# Patient Record
Sex: Female | Born: 1960 | Race: White | Hispanic: No | Marital: Married | Smoking: Never smoker
Health system: Southern US, Community
[De-identification: ages and names within clinical notes are randomized; demographics above are authoritative.]

## PROBLEM LIST (undated history)

## (undated) DIAGNOSIS — K5790 Diverticulosis of intestine, part unspecified, without perforation or abscess without bleeding: Secondary | ICD-10-CM

## (undated) DIAGNOSIS — E785 Hyperlipidemia, unspecified: Secondary | ICD-10-CM

## (undated) DIAGNOSIS — R51 Headache: Secondary | ICD-10-CM

## (undated) DIAGNOSIS — T7840XA Allergy, unspecified, initial encounter: Secondary | ICD-10-CM

## (undated) DIAGNOSIS — T4145XA Adverse effect of unspecified anesthetic, initial encounter: Secondary | ICD-10-CM

## (undated) DIAGNOSIS — R7309 Other abnormal glucose: Secondary | ICD-10-CM

## (undated) DIAGNOSIS — F419 Anxiety disorder, unspecified: Secondary | ICD-10-CM

## (undated) DIAGNOSIS — K219 Gastro-esophageal reflux disease without esophagitis: Secondary | ICD-10-CM

## (undated) DIAGNOSIS — T8859XA Other complications of anesthesia, initial encounter: Secondary | ICD-10-CM

## (undated) DIAGNOSIS — I10 Essential (primary) hypertension: Secondary | ICD-10-CM

## (undated) DIAGNOSIS — F329 Major depressive disorder, single episode, unspecified: Secondary | ICD-10-CM

## (undated) DIAGNOSIS — R32 Unspecified urinary incontinence: Secondary | ICD-10-CM

## (undated) DIAGNOSIS — M1990 Unspecified osteoarthritis, unspecified site: Secondary | ICD-10-CM

## (undated) DIAGNOSIS — J45909 Unspecified asthma, uncomplicated: Secondary | ICD-10-CM

## (undated) DIAGNOSIS — G4700 Insomnia, unspecified: Secondary | ICD-10-CM

## (undated) DIAGNOSIS — Z460 Encounter for fitting and adjustment of spectacles and contact lenses: Secondary | ICD-10-CM

## (undated) HISTORY — DX: Essential (primary) hypertension: I10

## (undated) HISTORY — DX: Hyperlipidemia, unspecified: E78.5

## (undated) HISTORY — DX: Insomnia, unspecified: G47.00

## (undated) HISTORY — DX: Diverticulosis of intestine, part unspecified, without perforation or abscess without bleeding: K57.90

## (undated) HISTORY — DX: Headache: R51

## (undated) HISTORY — DX: Major depressive disorder, single episode, unspecified: F32.9

## (undated) HISTORY — DX: Other abnormal glucose: R73.09

## (undated) HISTORY — PX: COLONOSCOPY: SHX174

## (undated) HISTORY — DX: Unspecified urinary incontinence: R32

## (undated) HISTORY — DX: Anxiety disorder, unspecified: F41.9

## (undated) HISTORY — DX: Allergy, unspecified, initial encounter: T78.40XA

## (undated) HISTORY — DX: Gastro-esophageal reflux disease without esophagitis: K21.9

---

## 1998-03-25 HISTORY — PX: ABDOMINAL HYSTERECTOMY: SHX81

## 1998-03-25 HISTORY — PX: PARTIAL HYSTERECTOMY: SHX80

## 1998-06-29 ENCOUNTER — Inpatient Hospital Stay (HOSPITAL_COMMUNITY): Admission: RE | Admit: 1998-06-29 | Discharge: 1998-07-02 | Payer: Self-pay | Admitting: Obstetrics & Gynecology

## 1999-03-13 ENCOUNTER — Other Ambulatory Visit: Admission: RE | Admit: 1999-03-13 | Discharge: 1999-03-13 | Payer: Self-pay | Admitting: Obstetrics & Gynecology

## 2000-07-23 ENCOUNTER — Other Ambulatory Visit: Admission: RE | Admit: 2000-07-23 | Discharge: 2000-07-23 | Payer: Self-pay | Admitting: Obstetrics & Gynecology

## 2001-07-30 ENCOUNTER — Other Ambulatory Visit: Admission: RE | Admit: 2001-07-30 | Discharge: 2001-07-30 | Payer: Self-pay | Admitting: Obstetrics & Gynecology

## 2002-08-16 ENCOUNTER — Other Ambulatory Visit: Admission: RE | Admit: 2002-08-16 | Discharge: 2002-08-16 | Payer: Self-pay | Admitting: Obstetrics & Gynecology

## 2003-09-21 ENCOUNTER — Other Ambulatory Visit: Admission: RE | Admit: 2003-09-21 | Discharge: 2003-09-21 | Payer: Self-pay | Admitting: Obstetrics & Gynecology

## 2004-10-18 ENCOUNTER — Other Ambulatory Visit: Admission: RE | Admit: 2004-10-18 | Discharge: 2004-10-18 | Payer: Self-pay | Admitting: Obstetrics & Gynecology

## 2005-03-25 HISTORY — PX: MOHS SURGERY: SUR867

## 2006-06-05 ENCOUNTER — Emergency Department (HOSPITAL_COMMUNITY): Admission: EM | Admit: 2006-06-05 | Discharge: 2006-06-05 | Payer: Self-pay | Admitting: Emergency Medicine

## 2006-10-14 ENCOUNTER — Emergency Department (HOSPITAL_COMMUNITY): Admission: EM | Admit: 2006-10-14 | Discharge: 2006-10-14 | Payer: Self-pay | Admitting: Emergency Medicine

## 2008-01-12 ENCOUNTER — Emergency Department (HOSPITAL_COMMUNITY): Admission: EM | Admit: 2008-01-12 | Discharge: 2008-01-12 | Payer: Self-pay | Admitting: Family Medicine

## 2008-03-25 HISTORY — PX: SHOULDER ARTHROSCOPY: SHX128

## 2008-06-17 ENCOUNTER — Emergency Department (HOSPITAL_COMMUNITY): Admission: EM | Admit: 2008-06-17 | Discharge: 2008-06-17 | Payer: Self-pay | Admitting: Family Medicine

## 2008-08-09 ENCOUNTER — Encounter: Admission: RE | Admit: 2008-08-09 | Discharge: 2008-08-09 | Payer: Self-pay | Admitting: Family Medicine

## 2008-12-02 ENCOUNTER — Ambulatory Visit: Payer: Self-pay | Admitting: Family Medicine

## 2008-12-02 DIAGNOSIS — F329 Major depressive disorder, single episode, unspecified: Secondary | ICD-10-CM

## 2008-12-02 DIAGNOSIS — R51 Headache: Secondary | ICD-10-CM | POA: Insufficient documentation

## 2008-12-02 DIAGNOSIS — E785 Hyperlipidemia, unspecified: Secondary | ICD-10-CM

## 2008-12-02 DIAGNOSIS — R7309 Other abnormal glucose: Secondary | ICD-10-CM | POA: Insufficient documentation

## 2008-12-02 DIAGNOSIS — R32 Unspecified urinary incontinence: Secondary | ICD-10-CM

## 2008-12-02 DIAGNOSIS — K219 Gastro-esophageal reflux disease without esophagitis: Secondary | ICD-10-CM

## 2008-12-02 DIAGNOSIS — F339 Major depressive disorder, recurrent, unspecified: Secondary | ICD-10-CM | POA: Insufficient documentation

## 2008-12-02 DIAGNOSIS — G4700 Insomnia, unspecified: Secondary | ICD-10-CM | POA: Insufficient documentation

## 2008-12-02 DIAGNOSIS — F3289 Other specified depressive episodes: Secondary | ICD-10-CM

## 2008-12-02 DIAGNOSIS — R519 Headache, unspecified: Secondary | ICD-10-CM | POA: Insufficient documentation

## 2008-12-02 HISTORY — DX: Unspecified urinary incontinence: R32

## 2008-12-02 HISTORY — DX: Hyperlipidemia, unspecified: E78.5

## 2008-12-02 HISTORY — DX: Gastro-esophageal reflux disease without esophagitis: K21.9

## 2008-12-02 HISTORY — DX: Major depressive disorder, single episode, unspecified: F32.9

## 2008-12-02 HISTORY — DX: Other specified depressive episodes: F32.89

## 2008-12-02 HISTORY — DX: Insomnia, unspecified: G47.00

## 2008-12-02 HISTORY — DX: Headache: R51

## 2008-12-02 HISTORY — DX: Other abnormal glucose: R73.09

## 2008-12-05 LAB — CONVERTED CEMR LAB
BUN: 16 mg/dL (ref 6–23)
Bilirubin, Direct: 0.1 mg/dL (ref 0.0–0.3)
Chloride: 105 meq/L (ref 96–112)
Glucose, Bld: 83 mg/dL (ref 70–99)
Indirect Bilirubin: 0.4 mg/dL (ref 0.0–0.9)
LDL Cholesterol: 81 mg/dL (ref 0–99)
Potassium: 4.1 meq/L (ref 3.5–5.3)
VLDL: 41 mg/dL — ABNORMAL HIGH (ref 0–40)

## 2009-01-09 ENCOUNTER — Ambulatory Visit: Payer: Self-pay | Admitting: Family Medicine

## 2009-01-17 ENCOUNTER — Telehealth: Payer: Self-pay | Admitting: Family Medicine

## 2009-01-23 ENCOUNTER — Telehealth: Payer: Self-pay | Admitting: Family Medicine

## 2009-03-06 ENCOUNTER — Ambulatory Visit: Payer: Self-pay | Admitting: Family Medicine

## 2009-03-13 ENCOUNTER — Telehealth: Payer: Self-pay | Admitting: Family Medicine

## 2009-07-28 ENCOUNTER — Encounter: Payer: Self-pay | Admitting: Family Medicine

## 2009-08-07 ENCOUNTER — Ambulatory Visit: Payer: Self-pay | Admitting: Family Medicine

## 2009-08-15 ENCOUNTER — Ambulatory Visit: Payer: Self-pay | Admitting: Family Medicine

## 2009-08-15 DIAGNOSIS — J309 Allergic rhinitis, unspecified: Secondary | ICD-10-CM | POA: Insufficient documentation

## 2009-08-15 DIAGNOSIS — R040 Epistaxis: Secondary | ICD-10-CM | POA: Insufficient documentation

## 2009-08-25 ENCOUNTER — Encounter (INDEPENDENT_AMBULATORY_CARE_PROVIDER_SITE_OTHER): Payer: Self-pay | Admitting: *Deleted

## 2009-11-13 ENCOUNTER — Encounter (INDEPENDENT_AMBULATORY_CARE_PROVIDER_SITE_OTHER): Payer: Self-pay | Admitting: *Deleted

## 2009-11-16 ENCOUNTER — Ambulatory Visit: Payer: Self-pay | Admitting: Gastroenterology

## 2009-11-29 LAB — HM COLONOSCOPY

## 2009-12-15 ENCOUNTER — Ambulatory Visit: Payer: Self-pay | Admitting: Gastroenterology

## 2009-12-15 HISTORY — PX: COLONOSCOPY: SHX174

## 2010-02-01 ENCOUNTER — Ambulatory Visit: Payer: Self-pay | Admitting: Family Medicine

## 2010-02-28 ENCOUNTER — Ambulatory Visit: Payer: Self-pay | Admitting: Family Medicine

## 2010-02-28 DIAGNOSIS — M79609 Pain in unspecified limb: Secondary | ICD-10-CM | POA: Insufficient documentation

## 2010-04-26 NOTE — Procedures (Signed)
Summary: Colonoscopy  Patient: Tracy Strong Note: All result statuses are Final unless otherwise noted.  Tests: (1) Colonoscopy (COL)   COL Colonoscopy           DONE     [facility]     [address]     [place], [place]  [address]           COLONOSCOPY PROCEDURE REPORT     PATIENT:  [name], [name]  MR#:  [id number]     BIRTHDATE:  Mar 06, 1961, 49 yrs. old  GENDER:  female     ENDOSCOPIST:  Judie Petit T. Russella Dar, MD, [facility]     Referred by:  Evelena Peat, M.D.     PROCEDURE DATE:  12/15/2009     PROCEDURE:  Colonoscopy [address]     ASA CLASS:  Class II     INDICATIONS:  1) Routine Risk Screening     MEDICATIONS:   Fentanyl 100 mcg IV, Versed 10 mg IV     DESCRIPTION OF PROCEDURE:   After the risks benefits and     alternatives of the procedure were thoroughly explained, informed     consent was obtained.  Digital rectal exam was performed and     revealed no abnormalities.   The LB [id number] endoscope     was introduced through the anus and advanced to the cecum, which     was identified by both the appendix and ileocecal valve, without     limitations.  The quality of the prep was excellent, using     MoviPrep.  The instrument was then slowly withdrawn as the colon     was fully examined.     <<PROCEDUREIMAGES>>     FINDINGS:  Mild diverticulosis was found in the sigmoid colon.  A     normal appearing cecum, ileocecal valve, and appendiceal orifice     were identified. The ascending, hepatic flexure, transverse,     splenic flexure, descending colon, and rectum appeared     unremarkable. Retroflexed views in the rectum revealed no     abnormalities. The time to cecum =  2  minutes. The scope was then     withdrawn (time =  8.33  min) from the patient and the procedure     completed.           COMPLICATIONS:  None           ENDOSCOPIC IMPRESSION:     1) Mild diverticulosis in the sigmoid colon           RECOMMENDATIONS:     1) High fiber diet with liberal fluid  intake.     2) Continue current colorectal screening for "routine risk"     patients with a repeat colonoscopy in 10 years.           Venita Lick. Russella Dar, MD, [facility]           n.     eSIGNED:   Venita Lick. Stark at 12/15/2009 04:08 PM           Willette Alma, [phone]  Note: An exclamation mark (!) indicates a result that was not dispersed into the flowsheet. Document Creation Date: 12/15/2009 4:08 PM _______________________________________________________________________  (1) Order result status: Final Collection or observation date-time: 12/15/2009 16:05 Requested date-time:  Receipt date-time:  Reported date-time:  Referring Physician:   Ordering Physician: Claudette Head [phone]) Specimen Source:  Source: Launa Grill Order Number: [id number] Lab site:   Appended Document: Colonoscopy  Clinical Lists Changes  Observations: Added new observation of COLONNXTDUE: 11/2019 (12/15/2009 16:15)

## 2010-04-26 NOTE — Assessment & Plan Note (Signed)
Summary: PT WILL COME IN AT 1:00 FOR FLU SHOT//SLM  Nurse Visit   Allergies: 1)  Avelox (Moxifloxacin Hcl)  Orders Added: 1)  Admin 1st Vaccine [[address]71] 2)  Flu Vaccine 26yrs + [[address]]        Flu Vaccine Consent Questions     Do you have a history of severe allergic reactions to this vaccine? no    Any prior history of allergic reactions to egg and/or gelatin? no    Do you have a sensitivity to the preservative Thimersol? no    Do you have a past history of Guillan-Barre Syndrome? no    Do you currently have an acute febrile illness? no    Have you ever had a severe reaction to latex? no    Vaccine information given and explained to patient? yes    Are you currently pregnant? no    Lot Number:[id number]   Exp Date:09/22/2010   Site Given  Left Deltoid IM

## 2010-04-26 NOTE — Letter (Signed)
Summary: [facility] Instructions  [facility]  [address] [place], [place] [address]   Phone: [phone]  Fax: [phone]       Tracy Strong    1960/08/27    MRN: [id number]        Procedure Day /Date:  Tuesday 11/28/2009      Arrival Time: 10:00 am      Procedure Time: 11:00 am     Location of Procedure:                    _x _  [facility] (4th Floor)                        PREPARATION FOR COLONOSCOPY WITH MOVIPREP   Starting 5 days prior to your procedure Thursday 9/1 do not eat nuts, seeds, popcorn, corn, beans, peas,  salads, or any raw vegetables.  Do not take any fiber supplements (e.g. Metamucil, Citrucel, and Benefiber).  THE DAY BEFORE YOUR PROCEDURE         DATE: Monday 9/5  1.  Drink clear liquids the entire day-NO SOLID FOOD  2.  Do not drink anything colored red or purple.  Avoid juices with pulp.  No orange juice.  3.  Drink at least 64 oz. (8 glasses) of fluid/clear liquids during the day to prevent dehydration and help the prep work efficiently.  CLEAR LIQUIDS INCLUDE: Water Jello Ice Popsicles Tea (sugar ok, no milk/cream) Powdered fruit flavored drinks Coffee (sugar ok, no milk/cream) Gatorade Juice: apple, white grape, white cranberry  Lemonade Clear bullion, consomm, broth Carbonated beverages (any kind) Strained chicken noodle soup Hard Candy                             4.  In the morning, mix first dose of MoviPrep solution:    Empty 1 Pouch A and 1 Pouch B into the disposable container    Add lukewarm drinking water to the top line of the container. Mix to dissolve    Refrigerate (mixed solution should be used within 24 hrs)  5.  Begin drinking the prep at 5:00 p.m. The MoviPrep container is divided by 4 marks.   Every 15 minutes drink the solution down to the next mark (approximately 8 oz) until the full liter is complete.   6.  Follow completed prep with 16 oz of clear liquid of your choice (Nothing  red or purple).  Continue to drink clear liquids until bedtime.  7.  Before going to bed, mix second dose of MoviPrep solution:    Empty 1 Pouch A and 1 Pouch B into the disposable container    Add lukewarm drinking water to the top line of the container. Mix to dissolve    Refrigerate  THE DAY OF YOUR PROCEDURE      DATE: Tuesday 9/6  Beginning at 6:00 a.m. (5 hours before procedure):         1. Every 15 minutes, drink the solution down to the next mark (approx 8 oz) until the full liter is complete.  2. Follow completed prep with 16 oz. of clear liquid of your choice.    3. You may drink clear liquids until 9:00 am (2 HOURS BEFORE PROCEDURE).   MEDICATION INSTRUCTIONS  Unless otherwise instructed, you should take regular prescription medications with a small sip of water   as early as possible the morning  of your procedure.           OTHER INSTRUCTIONS  You will need a responsible adult at least 51 years of age to accompany you and drive you home.   This person must remain in the waiting room during your procedure.  Wear loose fitting clothing that is easily removed.  Leave jewelry and other valuables at home.  However, you may wish to bring a book to read or  an iPod/MP3 player to listen to music as you wait for your procedure to start.  Remove all body piercing jewelry and leave at home.  Total time from sign-in until discharge is approximately 2-3 hours.  You should go home directly after your procedure and rest.  You can resume normal activities the  day after your procedure.  The day of your procedure you should not:   Drive   Make legal decisions   Operate machinery   Drink alcohol   Return to work  You will receive specific instructions about eating, activities and medications before you leave.    The above instructions have been reviewed and explained to me by   Ezra Sites RN  November 16, 2009 1:33 PM    I fully understand and can  verbalize these instructions _____________________________ Date _________

## 2010-04-26 NOTE — Assessment & Plan Note (Signed)
Summary: frequent nose bleeds/dm   Vital Signs:  Patient profile:   50 year old female Menstrual status:  hysterectomy Weight:      189 pounds Temp:     97.8 degrees F oral BP sitting:   120 / 82  (left arm) Cuff size:   regular  Vitals Entered By: Sid Falcon LPN (Aug 15, 2009 9:36 AM) CC: frequent nose bleeds   History of Present Illness: Recent Epistaxis R nose with 2 episodes yesterday lasting about 2 minutes each. No ASA use.  used saline nasal spray. Improved with pressure.  Using Claritan which she thinks is excessively drying. No other bleeding problems.  Allergies: 1)  Avelox (Moxifloxacin Hcl)  Past History:  Past Medical History: Last updated: 12/02/2008 Chicken pox Depression GERD Headache/Migraines Hyperlipidemia Urinary incontinence Hay fever/allergies Hx basal cell ca face Cronic insomnia  Review of Systems  The patient denies anorexia, fever, weight loss, and headaches.    Physical Exam  General:  Well-developed,well-nourished,in no acute distress; alert,appropriate and cooperative throughout examination Ears:  External ear exam shows no significant lesions or deformities.  Otoscopic examination reveals clear canals, tympanic membranes are intact bilaterally without bulging, retraction, inflammation or discharge. Hearing is grossly normal bilaterally. Nose:  R naris dry mucosa with scant dried blood ant nasal septum.  L naris clear. Mouth:  Oral mucosa and oropharynx without lesions or exudates.  Teeth in good repair. Lungs:  Normal respiratory effort, chest expands symmetrically. Lungs are clear to auscultation, no crackles or wheezes. Heart:  Normal rate and regular rhythm. S1 and S2 normal without gallop, murmur, click, rub or other extra sounds.   Impression & Recommendations:  Problem # 1:  NOSEBLEED (ICD-784.7) Assessment New suspect sec to drying.  Hold claritan.  Nasal vaseline and cont saline nose spray.  Problem # 2:  ALLERGIC  RHINITIS (ICD-477.9) trial of singulair 10 mg by mouth once daily.  Complete Medication List: 1)  Nexium 40 Mg Cpdr (Esomeprazole magnesium) .... Once daily 2)  Lexapro 10 Mg Tabs (Escitalopram oxalate) .... 1/2 tab daily 3)  Crestor 10 Mg Tabs (Rosuvastatin calcium) .... Once daily 4)  Vivelle-dot 0.1 Mg/24hr Pttw (Estradiol) .... Twice weekly 5)  Calcium 600 1500 Mg Tabs (Calcium carbonate) .... Once daily 6)  Daily Multiple Vitamins Tabs (Multiple vitamin) .... Once daily 7)  Zolpidem Tartrate 10 Mg Tabs (Zolpidem tartrate) .... One by mouth at bedtime 8)  Alprazolam 0.25 Mg Tabs (Alprazolam) .... One by mouth every 8 hours as needed anxiety. 9)  Vit D-3 200mg   .... Once daily 10)  Singulair 10 Mg Tabs (Montelukast sodium) .... One by mouth once daily as needed allergies.  Patient Instructions: 1)  continue use of saline nasal spray 2)  Consider use of Vaseline to right naris at night  Prescriptions: SINGULAIR 10 MG TABS (MONTELUKAST SODIUM) one by mouth once daily as needed allergies.  #30 x 6   Entered and Authorized by:   Evelena Peat MD   Signed by:   Evelena Peat MD on 08/15/2009   Method used:   Print then Give to Patient   RxID:   [id number]

## 2010-04-26 NOTE — Letter (Signed)
Summary: Previsit letter  [facility]  [address] [place], [place] [address]   Phone: [phone]  Fax: [phone]       08/25/2009 MRN: [id number]  [facility] [address] [place], [place]  [address]  Dear Tracy Strong,  Welcome to the Gastroenterology Division at [facility].    You are scheduled to see a nurse for your pre-procedure visit on 09-21-09 at 11:00a.m. on the 3rd floor at [facility], [address]  We ask that you try to arrive at our office 15 minutes prior to your appointment time to allow for check-in.  Your nurse visit will consist of discussing your medical and surgical history, your immediate family medical history, and your medications.    Please bring a complete list of all your medications or, if you prefer, bring the medication bottles and we will list them.  We will need to be aware of both prescribed and over the counter drugs.  We will need to know exact dosage information as well.  If you are on blood thinners (Coumadin, Plavix, Aggrenox, Ticlid, etc.) please call our office today/prior to your appointment, as we need to consult with your physician about holding your medication.   Please be prepared to read and sign documents such as consent forms, a financial agreement, and acknowledgement forms.  If necessary, and with your consent, a friend or relative is welcome to sit-in on the nurse visit with you.  Please bring your insurance card so that we may make a copy of it.  If your insurance requires a referral to see a specialist, please bring your referral form from your primary care physician.  No co-pay is required for this nurse visit.     If you cannot keep your appointment, please call [phone] to cancel or reschedule prior to your appointment date.  This allows [place] the opportunity to schedule an appointment for another patient in need of care.    Thank you for choosing [facility] for your medical  needs.  We appreciate the opportunity to care for you.  Please visit [place] at our website  to learn more about our practice.                     Sincerely.                                                                                                                   The Gastroenterology Division

## 2010-04-26 NOTE — Assessment & Plan Note (Signed)
Summary: 6 MTH ROV // RS   Vital Signs:  Patient profile:   50 year old female Menstrual status:  hysterectomy Weight:      189 pounds Temp:     98.2 degrees F oral BP sitting:   110 / 80  (left arm) Cuff size:   regular  Vitals Entered By: Sid Falcon LPN (Aug 07, 2009 1:27 PM) CC: 6 month follow-up   History of Present Illness: Patient for followup multiple medical items as below.  History of GERD controlled with Nexium. She has breakthrough symptoms when trying to discontinue. Symptoms not controlled with over-the-counter antacids.  Hyperlipidemia treated Crestor. Tolerating well. Lipids at goal last September.  Chronic insomnia. Tried reducing Ambien to 5 mg but had difficulty sleeping. She has titrated back to 10 mg.  No specific stressors.  Denies depressive symptoms.  Patient turned 50 earlier this year. No history of screening colonoscopy. She would like to schedule. No FH of colon cancer or polyps.  No recent change in bowel habits.  Allergies: 1)  Avelox (Moxifloxacin Hcl)  Past History:  Past Medical History: Last updated: 12/02/2008 Chicken pox Depression GERD Headache/Migraines Hyperlipidemia Urinary incontinence Hay fever/allergies Hx basal cell ca face Cronic insomnia  Past Surgical History: Last updated: 12/02/2008 Partial hyst 2000 moh's surgery nose 2007  Family History: Last updated: 12/02/2008 Family History Hypertension Family History Lung cancer Family History of Prostate CA Family history heart disease  Social History: Last updated: 12/02/2008 Widow/Widower Never Smoked Alcohol use-yes two children  Risk Factors: Smoking Status: never (12/02/2008)  Review of Systems       The patient complains of weight gain.  The patient denies anorexia, fever, weight loss, chest pain, syncope, dyspnea on exertion, peripheral edema, prolonged cough, headaches, hemoptysis, abdominal pain, melena, hematochezia, and severe  indigestion/heartburn.    Physical Exam  General:  Well-developed,well-nourished,in no acute distress; alert,appropriate and cooperative throughout examination Head:  Normocephalic and atraumatic without obvious abnormalities. No apparent alopecia or balding. Mouth:  Oral mucosa and oropharynx without lesions or exudates.  Teeth in good repair. Neck:  No deformities, masses, or tenderness noted. Lungs:  Normal respiratory effort, chest expands symmetrically. Lungs are clear to auscultation, no crackles or wheezes. Heart:  Normal rate and regular rhythm. S1 and S2 normal without gallop, murmur, click, rub or other extra sounds.   Impression & Recommendations:  Problem # 1:  HYPERLIPIDEMIA (ICD-272.4) repeat lipids in 6 months. Her updated medication list for this problem includes:    Crestor 10 Mg Tabs (Rosuvastatin calcium) ..... Once daily  Problem # 2:  GERD (ICD-530.81)  Her updated medication list for this problem includes:    Nexium 40 Mg Cpdr (Esomeprazole magnesium) ..... Once daily  Problem # 3:  INSOMNIA (ICD-780.52) sleep hygiene discussed. Her updated medication list for this problem includes:    Zolpidem Tartrate 10 Mg Tabs (Zolpidem tartrate) ..... One by mouth at bedtime  Problem # 4:  Preventive Health Care (ICD-V70.0) needs screening colonoscopy.  Pt sees gynecologist for mammograms and pap smears.  Complete Medication List: 1)  Nexium 40 Mg Cpdr (Esomeprazole magnesium) .... Once daily 2)  Lexapro 10 Mg Tabs (Escitalopram oxalate) .... 1/2 tab daily 3)  Crestor 10 Mg Tabs (Rosuvastatin calcium) .... Once daily 4)  Vivelle-dot 0.1 Mg/24hr Pttw (Estradiol) .... Twice weekly 5)  Calcium 600 1500 Mg Tabs (Calcium carbonate) .... Once daily 6)  Daily Multiple Vitamins Tabs (Multiple vitamin) .... Once daily 7)  Zolpidem Tartrate 10 Mg Tabs (Zolpidem tartrate) .Marland KitchenMarland KitchenMarland Kitchen  One by mouth at bedtime 8)  Alprazolam 0.25 Mg Tabs (Alprazolam) .... One by mouth every 8 hours as  needed anxiety. 9)  Vit D-3 200mg   .... Once daily  Other Orders: Gastroenterology Referral (GI)  Patient Instructions: 1)  Please schedule a follow-up appointment in 6 months .  2)  It is important that you exercise reguarly at least 20 minutes 5 times a week. If you develop chest pain, have severe difficulty breathing, or feel very tired, stop exercising immediately and seek medical attention.  3)  You need to lose weight. Consider a lower calorie diet and regular exercise.  Prescriptions: ZOLPIDEM TARTRATE 10 MG TABS (ZOLPIDEM TARTRATE) one by mouth at bedtime  #90 x 1   Entered and Authorized by:   Evelena Peat MD   Signed by:   Evelena Peat MD on 08/07/2009   Method used:   Print then Give to Patient   RxID:   [id number] CRESTOR 10 MG TABS (ROSUVASTATIN CALCIUM) once daily  #90 x 3   Entered and Authorized by:   Evelena Peat MD   Signed by:   Evelena Peat MD on 08/07/2009   Method used:   Print then Give to Patient   RxID:   [id number] NEXIUM 40 MG CPDR (ESOMEPRAZOLE MAGNESIUM) once daily  #90 x 3   Entered and Authorized by:   Evelena Peat MD   Signed by:   Evelena Peat MD on 08/07/2009   Method used:   Print then Give to Patient   RxID:   [id number]   Preventive Care Screening  Mammogram:    Date:  05/23/2009    Results:  normal   Pap Smear:    Date:  05/23/2009    Results:  normal

## 2010-04-26 NOTE — Assessment & Plan Note (Signed)
Summary: ?blood clot in leg/pain in leg/cjr   Vital Signs:  Patient profile:   50 year old female Menstrual status:  hysterectomy Weight:      183 pounds Temp:     98.0 degrees F oral BP sitting:   120 / 80  (left arm) Cuff size:   regular  Vitals Entered By: Sid Falcon LPN (February 28, 2010 1:57 PM)  History of Present Illness: patient seen with right calf pain. Occurred while bowling last night around 7:30 PM. First noted pain when she was going down to release the ball. She continued to bowl. Soreness is increased today. Pain is worse with going up and down stairs. No ecchymosis. No Achilles pain.  Patient was concerned about possible blood clot though she's not noticed any edema or color changes to lower extremity. No personal history of DVT and no family history in first degree relatives other than her daughter who had thoracic outlet syndrome.  Allergies: 1)  Avelox (Moxifloxacin Hcl)  Past History:  Past Medical History: Last updated: 12/02/2008 Chicken pox Depression GERD Headache/Migraines Hyperlipidemia Urinary incontinence Hay fever/allergies Hx basal cell ca face Cronic insomnia  Past Surgical History: Last updated: 12/02/2008 Partial hyst 2000 moh's surgery nose 2007  Family History: Last updated: 12/02/2008 Family History Hypertension Family History Lung cancer Family History of Prostate CA Family history heart disease  Social History: Last updated: 12/02/2008 Widow/Widower Never Smoked Alcohol use-yes two children  Risk Factors: Smoking Status: never (12/02/2008)  Physical Exam  General:  Well-developed,well-nourished,in no acute distress; alert,appropriate and cooperative throughout examination Lungs:  Normal respiratory effort, chest expands symmetrically. Lungs are clear to auscultation, no crackles or wheezes. Heart:  Normal rate and regular rhythm. S1 and S2 normal without gallop, murmur, click, rub or other extra  sounds. Extremities:  right calf reveals no ecchymosis or visible swelling. She has some minimal tenderness along the medial aspect of gastrocnemius muscle. Minimal pain with plantar flexion but none with dorsiflexion   Impression & Recommendations:  Problem # 1:  CALF PAIN (ICD-729.5) Assessment New Clinically, no evidence for DVT. Suspected gastrocnemius muscle strain. Recommend ice and gentle stretches an over-the-counter anti-inflammatory as needed  Complete Medication List: 1)  Nexium 40 Mg Cpdr (Esomeprazole magnesium) .... Once daily 2)  Lexapro 10 Mg Tabs (Escitalopram oxalate) .... 1/2 tab daily 3)  Crestor 10 Mg Tabs (Rosuvastatin calcium) .... Once daily 4)  Vivelle-dot 0.1 Mg/24hr Pttw (Estradiol) .... Twice weekly 5)  Calcium 600 1500 Mg Tabs (Calcium carbonate) .... Once daily 6)  Daily Multiple Vitamins Tabs (Multiple vitamin) .... Once daily 7)  Zolpidem Tartrate 10 Mg Tabs (Zolpidem tartrate) .... One by mouth at bedtime 8)  Alprazolam 0.25 Mg Tabs (Alprazolam) .... One by mouth every 8 hours as needed anxiety. 9)  Vit D-3 200mg   .... Once daily 10)  Singulair 10 Mg Tabs (Montelukast sodium) .... One by mouth once daily as needed allergies.  Patient Instructions: 1)  Ice calf muscle for the next one to 2 days 2)  General stretches to calf muscle after acute pain subsides 3)  Try over-the-counter Aleve or Advil for the next few days as tolerated 4)  Please schedule a follow-up appointment in 3 months .  5)  Hepatic Panel prior to visit ICD-9: 272.4 6)  Lipid panel prior to visit ICD-9 : 272.4 Prescriptions: ALPRAZOLAM 0.25 MG TABS (ALPRAZOLAM) One by mouth every 8 hours as needed anxiety.  #30 x 0   Entered and Authorized by:   Smitty Cords  Burchette MD   Signed by:   Evelena Peat MD on 02/28/2010   Method used:   Print then Give to Patient   RxID:   [id number]    Orders Added: 1)  Est. Patient Level III [[address]]

## 2010-04-26 NOTE — Miscellaneous (Signed)
Summary: [facility]  Clinical Lists Changes  Medications: Added new medication of MOVIPREP 100 GM  SOLR (PEG-KCL-NACL-NASULF-NA ASC-C) As per prep instructions. - Signed Rx of MOVIPREP 100 GM  SOLR (PEG-KCL-NACL-NASULF-NA ASC-C) As per prep instructions.;  #1 x 0;  Signed;  Entered by: Ezra Sites RN;  Authorized by: Meryl Dare MD [facility];  Method used: Electron[facility] to [id number] [address] [place], [place]  [address], Ph: [phone] or [phone], Fax: [phone] Allergies: Changed allergy or adverse reaction from AVELOX (MOXIFLOXACIN HCL) to AVELOX (MOXIFLOXACIN HCL)    Prescriptions: MOVIPREP 100 GM  SOLR (PEG-KCL-NACL-NASULF-NA ASC-C) As per prep instructions.  #1 x 0   Entered by:   Ezra Sites RN   Authorized by:   Meryl Dare MD [facility]   Signed by:   Ezra Sites RN on 11/16/2009   Method used:   [facility] to        [phone][facility] (retail)       [address]       [place], [place]  [address]       Ph: [phone] or [phone]       Fax: [phone]   RxID:   [id number]

## 2010-04-26 NOTE — Letter (Signed)
Summary: [facility]  [facility]   Imported By: Maryln Gottron 08/17/2009 15:32:45  _____________________________________________________________________  External Attachment:    Type:   Image     Comment:   External Document

## 2010-05-02 LAB — HM MAMMOGRAPHY: HM Mammogram: NORMAL

## 2010-05-25 ENCOUNTER — Other Ambulatory Visit (INDEPENDENT_AMBULATORY_CARE_PROVIDER_SITE_OTHER): Admitting: Family Medicine

## 2010-05-25 DIAGNOSIS — E785 Hyperlipidemia, unspecified: Secondary | ICD-10-CM

## 2010-05-25 DIAGNOSIS — T887XXA Unspecified adverse effect of drug or medicament, initial encounter: Secondary | ICD-10-CM

## 2010-05-25 LAB — HEPATIC FUNCTION PANEL
ALT: 26 U/L (ref 0–35)
AST: 27 U/L (ref 0–37)
Bilirubin, Direct: 0.1 mg/dL (ref 0.0–0.3)
Total Bilirubin: 0.7 mg/dL (ref 0.3–1.2)

## 2010-05-25 LAB — LIPID PANEL
LDL Cholesterol: 83 mg/dL (ref 0–99)
Total CHOL/HDL Ratio: 3

## 2010-05-25 NOTE — Progress Notes (Signed)
Quick Note:  Pt informed ______ 

## 2010-05-30 ENCOUNTER — Encounter: Payer: Self-pay | Admitting: Family Medicine

## 2010-05-31 ENCOUNTER — Encounter: Payer: Self-pay | Admitting: Family Medicine

## 2010-05-31 ENCOUNTER — Ambulatory Visit (INDEPENDENT_AMBULATORY_CARE_PROVIDER_SITE_OTHER): Admitting: Family Medicine

## 2010-05-31 DIAGNOSIS — J309 Allergic rhinitis, unspecified: Secondary | ICD-10-CM

## 2010-05-31 DIAGNOSIS — F410 Panic disorder [episodic paroxysmal anxiety] without agoraphobia: Secondary | ICD-10-CM

## 2010-05-31 DIAGNOSIS — K219 Gastro-esophageal reflux disease without esophagitis: Secondary | ICD-10-CM

## 2010-05-31 DIAGNOSIS — F329 Major depressive disorder, single episode, unspecified: Secondary | ICD-10-CM

## 2010-05-31 DIAGNOSIS — G4700 Insomnia, unspecified: Secondary | ICD-10-CM

## 2010-05-31 MED ORDER — SERTRALINE HCL 50 MG PO TABS: 50.0000 mg | ORAL_TABLET | Freq: Every day | ORAL | Status: DC

## 2010-05-31 MED ORDER — ESOMEPRAZOLE MAGNESIUM 40 MG PO CPDR: 40.0000 mg | DELAYED_RELEASE_CAPSULE | Freq: Every day | ORAL | Status: DC

## 2010-05-31 MED ORDER — ZOLPIDEM TARTRATE 5 MG PO TABS: 5.0000 mg | ORAL_TABLET | Freq: Every evening | ORAL | Status: DC | PRN

## 2010-05-31 MED ORDER — MONTELUKAST SODIUM 10 MG PO TABS: 10.0000 mg | ORAL_TABLET | Freq: Every day | ORAL | Status: DC

## 2010-05-31 NOTE — Progress Notes (Signed)
  Subjective:    Patient ID: Tracy Strong, female    DOB: 10-May-1960, 50 y.o.   MRN: [id number]  HPI  Patient seen for followup multiple items as below   history recurrent anxiety episodes. Prior diagnosis panic attack. She relates that she has unpredictable episodes of anxiety palpitations sometimes diaphoresis usually last several minutes. Takes alprazolam which seems to help. These are occurring more frequently. No specific stressors. These do not occur in any one specific setting. Past history of depression the mood is stable.   History allergic rhinitis. Well controlled on Singulair. Needs refills.  History of GERD controlled with Nexium 40 mg daily. Occasionally supplements with an acid. Recurrent symptoms  Each time she has stopped. No dysphagia.    chronic insomnia. Takes Ambien 5 mg nightly. Recurrent insomnia issues when not taking medication. Sleep hygiene has been discussed.   Review of Systems     Objective:   Physical Exam  patient is alert cooperative and in no distress. Oropharynx is clear Neck supple no mass Chest clear to auscultation Heart regular rate and rhythm no murmur Extremities no edema       Assessment & Plan:   #1 allergic rhinitis. Refilled Singulair for one year #2 GERD fairly well-controlled with Nexium. Refill medication for one year #3 history of chronic insomnia refill  Ambien 5 mg.  #4 probable panic disorder. Start sertraline 50 mg daily and reassess 2 months

## 2010-08-02 ENCOUNTER — Ambulatory Visit: Admitting: Family Medicine

## 2010-08-07 ENCOUNTER — Encounter: Payer: Self-pay | Admitting: Family Medicine

## 2010-08-07 ENCOUNTER — Ambulatory Visit (INDEPENDENT_AMBULATORY_CARE_PROVIDER_SITE_OTHER): Admitting: Family Medicine

## 2010-08-07 VITALS — BP 120/74 | Temp 98.4°F | Wt 181.0 lb

## 2010-08-07 DIAGNOSIS — M791 Myalgia, unspecified site: Secondary | ICD-10-CM

## 2010-08-07 DIAGNOSIS — IMO0001 Reserved for inherently not codable concepts without codable children: Secondary | ICD-10-CM

## 2010-08-07 DIAGNOSIS — F410 Panic disorder [episodic paroxysmal anxiety] without agoraphobia: Secondary | ICD-10-CM

## 2010-08-07 MED ORDER — SERTRALINE HCL 50 MG PO TABS: 50.0000 mg | ORAL_TABLET | Freq: Every day | ORAL | Status: DC

## 2010-08-07 NOTE — Progress Notes (Signed)
  Subjective:    Patient ID: Tracy Strong, female    DOB: 1960/10/08, 50 y.o.   MRN: [id number]  HPI Patient seen for the following issues  Recent anxiety symptoms. Probable panic disorder. Started sertraline 50 mg daily. Great improvement both in reduction and episodes of anxiety and severity. At least 70% improved. She is very happy with dosage. Takes alprazolam but much less often now. Sleep is good. No adverse side effects from sertraline.  Mild myalgias. Request vitamin D level. Takes over-the-counter calcium and vitamin D replacement. No history of osteopenia or osteoporosis.   Review of Systems  Constitutional: Negative for activity change, appetite change and unexpected weight change.  Respiratory: Negative for cough and shortness of breath.   Cardiovascular: Negative for chest pain, palpitations and leg swelling.  Psychiatric/Behavioral: Negative for dysphoric mood and agitation.       Objective:   Physical Exam  Constitutional: She is oriented to person, place, and time. She appears well-developed and well-nourished.  Cardiovascular: Normal rate, regular rhythm and normal heart sounds.   Pulmonary/Chest: Effort normal and breath sounds normal. No respiratory distress. She has no wheezes. She has no rales.  Musculoskeletal: She exhibits no edema.  Neurological: She is alert and oriented to person, place, and time.  Psychiatric: She has a normal mood and affect. Her behavior is normal. Judgment and thought content normal.          Assessment & Plan:  #1 probable panic disorder. Greatly improved. Refilled sertraline for one year #2 mild myalgias. Check vitamin D level

## 2010-08-08 NOTE — Progress Notes (Signed)
Quick Note:  Pt informed on home VM ______ 

## 2010-08-16 ENCOUNTER — Emergency Department (HOSPITAL_COMMUNITY)
Admission: EM | Admit: 2010-08-16 | Discharge: 2010-08-16 | Disposition: A | Discharge status: Home / Self Care | Payer: No Typology Code available for payment source | Attending: Emergency Medicine | Admitting: Emergency Medicine

## 2010-08-16 DIAGNOSIS — T148XXA Other injury of unspecified body region, initial encounter: Secondary | ICD-10-CM | POA: Insufficient documentation

## 2010-08-16 DIAGNOSIS — E785 Hyperlipidemia, unspecified: Secondary | ICD-10-CM | POA: Insufficient documentation

## 2010-08-16 DIAGNOSIS — K219 Gastro-esophageal reflux disease without esophagitis: Secondary | ICD-10-CM | POA: Insufficient documentation

## 2010-08-16 DIAGNOSIS — M545 Low back pain, unspecified: Secondary | ICD-10-CM | POA: Insufficient documentation

## 2010-08-16 DIAGNOSIS — S0083XA Contusion of other part of head, initial encounter: Secondary | ICD-10-CM | POA: Insufficient documentation

## 2010-08-16 DIAGNOSIS — S0003XA Contusion of scalp, initial encounter: Secondary | ICD-10-CM | POA: Insufficient documentation

## 2010-08-16 DIAGNOSIS — F411 Generalized anxiety disorder: Secondary | ICD-10-CM | POA: Insufficient documentation

## 2010-08-24 ENCOUNTER — Ambulatory Visit (INDEPENDENT_AMBULATORY_CARE_PROVIDER_SITE_OTHER): Admitting: Family Medicine

## 2010-08-24 ENCOUNTER — Encounter: Payer: Self-pay | Admitting: Family Medicine

## 2010-08-24 DIAGNOSIS — S060X9A Concussion with loss of consciousness of unspecified duration, initial encounter: Secondary | ICD-10-CM

## 2010-08-24 DIAGNOSIS — R51 Headache: Secondary | ICD-10-CM
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
## 2010-08-24 NOTE — Patient Instructions (Signed)
Head Injuries, Adult You have had a head injury which does not appear serious at this time. A concussion is a state of changed mental ability, usually from a blow to the head. You should take clear liquids for the rest of the day and then resume your regular diet. You should not take sedatives or alcoholic beverages for 24 hours after discharge. After injuries such as yours, most problems occur within the first 24 hours.  THESE MINOR SYMPTOMS MAY BE SEEN AFTER DISCHARGE:  Memory difficulties  Dizziness   Headaches   Double vision  Hearing difficulties   Depression   Tiredness  Weakness   Difficulty with concentration   If you experience any of these problems, you should not be alarmed. A concussion requires a few days for recovery. Many patients with head injuries frequently experience such symptoms. Usually, these problems disappear without medical care. If symptoms last for more than one day, notify your caregiver. See your caregiver sooner if symptoms are becoming worse rather than better. HOME CARE INSTRUCTIONS  During the next 24 hours you must stay with someone who can watch you for the warning signs listed below.  Although it is unlikely that serious side effects will occur, you should be aware of signs and symptoms which may necessitate your return to this location. Side effects may occur up to 7 - 10 days following the injury. It is important for you to carefully monitor your condition and contact your caregiver or seek immediate medical attention if there is a change in your condition. 1. SEEK IMMEDIATE MEDICAL CARE IF:   There is confusion or drowsiness.   You can not awaken the injured person.   There is nausea (feeling sick to your stomach) or continued, forceful vomiting.   You notice dizziness or unsteadiness which is getting worse, or inability to walk.   You have convulsions or unconsciousness.   You experience severe, persistent headaches not relieved by  over-the-counter or prescription medicines for pain. (Do not take aspirin as this impairs clotting abilities). Take other pain medications only as directed.   You can not use arms or legs normally.   There is clear or bloody discharge from the nose or ears.  MAKE SURE YOU:   Understand these instructions.   Will watch your condition.   Will get help right away if you are not doing well or get worse.  Document Released: 03/11/2005 Document Re-Released: 02/27/2009 [facility] Patient Information 2011 [place], [place].

## 2010-09-10 ENCOUNTER — Telehealth: Payer: Self-pay | Admitting: *Deleted

## 2010-09-10 DIAGNOSIS — R519 Headache, unspecified: Secondary | ICD-10-CM
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
## 2010-09-10 NOTE — Telephone Encounter (Signed)
Agree.  If still headaches at this time, CT head without contrast.  Will order.

## 2010-09-13 ENCOUNTER — Ambulatory Visit (INDEPENDENT_AMBULATORY_CARE_PROVIDER_SITE_OTHER)
Admission: RE | Admit: 2010-09-13 | Discharge: 2010-09-13 | Disposition: A | Discharge status: Home / Self Care | Source: Ambulatory Visit | Attending: Family Medicine | Admitting: Family Medicine

## 2010-09-13 DIAGNOSIS — R519 Headache, unspecified: Secondary | ICD-10-CM

## 2010-09-13 DIAGNOSIS — R51 Headache: Secondary | ICD-10-CM

## 2010-09-20 NOTE — Progress Notes (Signed)
Quick Note:  Pt informed on personally identified VM ______ 

## 2010-10-01 ENCOUNTER — Telehealth: Payer: Self-pay | Admitting: *Deleted

## 2010-10-01 DIAGNOSIS — K219 Gastro-esophageal reflux disease without esophagitis: Secondary | ICD-10-CM

## 2010-10-01 MED ORDER — ROSUVASTATIN CALCIUM 10 MG PO TABS: 10.0000 mg | ORAL_TABLET | Freq: Every day | ORAL | Status: DC

## 2010-10-01 MED ORDER — ESOMEPRAZOLE MAGNESIUM 40 MG PO CPDR: 40.0000 mg | DELAYED_RELEASE_CAPSULE | Freq: Every day | ORAL | Status: DC

## 2010-10-01 NOTE — Telephone Encounter (Signed)
VM from pt, she lost her written Rx for nexium and crestor for mail order, will re-print. Pt informed ready for pick-up on VM

## 2010-12-11 ENCOUNTER — Other Ambulatory Visit: Payer: Self-pay | Admitting: Family Medicine

## 2010-12-11 DIAGNOSIS — M25531 Pain in right wrist: Secondary | ICD-10-CM

## 2010-12-19 ENCOUNTER — Ambulatory Visit
Admission: RE | Admit: 2010-12-19 | Discharge: 2010-12-19 | Disposition: A | Discharge status: Home / Self Care | Payer: Self-pay | Source: Ambulatory Visit | Attending: Family Medicine | Admitting: Family Medicine

## 2010-12-19 ENCOUNTER — Encounter: Payer: Self-pay | Admitting: Family Medicine

## 2010-12-19 ENCOUNTER — Ambulatory Visit (INDEPENDENT_AMBULATORY_CARE_PROVIDER_SITE_OTHER): Admitting: Family Medicine

## 2010-12-19 VITALS — BP 120/80 | Temp 98.2°F | Wt 187.0 lb

## 2010-12-19 DIAGNOSIS — F419 Anxiety disorder, unspecified: Secondary | ICD-10-CM

## 2010-12-19 DIAGNOSIS — M25531 Pain in right wrist: Secondary | ICD-10-CM

## 2010-12-19 DIAGNOSIS — L0293 Carbuncle, unspecified: Secondary | ICD-10-CM

## 2010-12-19 DIAGNOSIS — L0292 Furuncle, unspecified: Secondary | ICD-10-CM

## 2010-12-19 DIAGNOSIS — F411 Generalized anxiety disorder: Secondary | ICD-10-CM

## 2010-12-19 MED ORDER — ALPRAZOLAM 0.25 MG PO TABS: 0.2500 mg | ORAL_TABLET | Freq: Three times a day (TID) | ORAL | Status: DC | PRN

## 2010-12-19 MED ORDER — IOHEXOL 180 MG/ML  SOLN
3.0000 mL | Freq: Once | INTRAMUSCULAR | Status: AC | PRN
  Administered 2010-12-19: 1.5 mL via INTRAVENOUS

## 2010-12-19 MED ORDER — CEPHALEXIN 500 MG PO CAPS: 500.0000 mg | ORAL_CAPSULE | Freq: Three times a day (TID) | ORAL | Status: AC

## 2010-12-19 NOTE — Patient Instructions (Signed)
Warm compresses to suprapubic region several times daily. Followup promptly for any fluctuance or increased pain or swelling.

## 2010-12-19 NOTE — Progress Notes (Signed)
  Subjective:    Patient ID: Tracy Strong, female    DOB: 12-Jul-1960, 50 y.o.   MRN: [id number]  HPI Patient seen with boil suprapubic region noted several weeks ago. Some surrounding erythema past couple days. Minimal purulent drainage with pressure yesterday. No fever or chills. Moderate soreness. History of allergy to moxifloxacin.  Patient requesting refill Xanax which she takes rarely for severe anxiety. Has MRI scan of her wrist this afternoon and requesting premedication with Xanax. Also upcoming travel to [place]   Review of Systems  Constitutional: Negative for fever and chills.  Respiratory: Negative for cough and shortness of breath.   Cardiovascular: Negative for chest pain.  Skin: Positive for rash.       Objective:   Physical Exam  Constitutional: She appears well-developed and well-nourished.  Cardiovascular: Normal rate, regular rhythm and normal heart sounds.   Pulmonary/Chest: Effort normal and breath sounds normal. No respiratory distress. She has no wheezes. She has no rales.  Skin:       Patient has small nonspecific firm papule minimally tender suprapubic region. No fluctuance. Papule approximately 3 mm to 4 mm diameter.  1 cm surrounding erythema          Assessment & Plan:  Ingrown hair/furuncle suprapubic region. No fluctuance to suggest significant abscess. Cephalexin 500 mg 3 times a day for 10 days continue warm soaks. Prompt followup if not resolving  #2 situational anxiety. Refill alprazolam

## 2011-03-01 ENCOUNTER — Encounter: Payer: Self-pay | Admitting: Family Medicine

## 2011-03-01 ENCOUNTER — Ambulatory Visit (INDEPENDENT_AMBULATORY_CARE_PROVIDER_SITE_OTHER): Admitting: Family Medicine

## 2011-03-01 DIAGNOSIS — Z23 Encounter for immunization: Secondary | ICD-10-CM

## 2011-03-01 DIAGNOSIS — J45909 Unspecified asthma, uncomplicated: Secondary | ICD-10-CM

## 2011-03-01 DIAGNOSIS — J309 Allergic rhinitis, unspecified: Secondary | ICD-10-CM

## 2011-03-01 MED ORDER — HYDROCODONE-HOMATROPINE 5-1.5 MG/5ML PO SYRP: ORAL_SOLUTION | ORAL | Status: DC

## 2011-03-01 MED ORDER — PREDNISONE 20 MG PO TABS: ORAL_TABLET | ORAL | Status: DC

## 2011-03-01 NOTE — Progress Notes (Signed)
  Subjective:    Patient ID: Tracy Strong, female    DOB: April 26, 1960, 50 y.o.   MRN: [id number]  HPI Tracy Strong is a 50 year old, widowed female.......Marland Kitchen Live-in boyfriend, who is well..... Nonsmoker..... Who comes in today for evaluation of a cold for 9 days.  She states that 9 days ago she developed weakness, cough, and didn't feel good.  She stated bed rest at home for 3 days.......... Currently employed.........  No fever, no earache, but she did develop laryngitis, and now her cough is worse.  She has a sensation of tightness in her chest.  No fever no sputum production.  She does have a history of allergic rhinitis and has been on Singulair.  She's had no difficulty in the past with viral infections.  No pneumonia.  No asthma.   Review of Systems    General and pulmonary review of systems otherwise negative Objective:   Physical Exam Well-developed well-nourished, female, in no acute distress.  Examination of the HEENT were negative.  The neck was supple.  No adenopathy.  Lungs show symmetrical.  Breath sounds, mild late expiratory bilateral wheezing      Assessment & Plan:  Viral syndrome with secondary wheezing.  Plan treat symptomatically with fluids, prednisone burst and taper, Hydromet.  Return p.r.n.

## 2011-03-01 NOTE — Progress Notes (Signed)
Addended by: Kern Reap B on: 03/01/2011 10:52 AM   Modules accepted: Orders

## 2011-03-01 NOTE — Patient Instructions (Signed)
Drink lots of water.  Hydromet one half to 1 teaspoon 3 times daily for cough.  Rest at home.  Begin D. Prednisone as outlined........... Salt free diet while on prednisone.  You may also use the nasal irrigation with saline.  Return p.r.n.

## 2011-03-04 ENCOUNTER — Telehealth: Payer: Self-pay | Admitting: Family Medicine

## 2011-03-04 NOTE — Telephone Encounter (Signed)
pls advise

## 2011-03-04 NOTE — Telephone Encounter (Signed)
Pt is still coughing and wheezing. Pt was told by pcp, to call on Monday if she is not feeling better. Pt says that her cough is improving a little bit, but there is a lot of tightness in her chest. May need to have an inhaler called in to [facility] in [facility].

## 2011-03-04 NOTE — Telephone Encounter (Signed)
ProAir 2 puffs q 4 hours prn cough and wheeze.  Fill one inhaler.  Make sure pt knows not to overuse. Needs to be seen if no better next few days.

## 2011-03-06 MED ORDER — ALBUTEROL SULFATE HFA 108 (90 BASE) MCG/ACT IN AERS: 2.0000 | INHALATION_SPRAY | RESPIRATORY_TRACT | Status: DC | PRN

## 2011-03-06 NOTE — Telephone Encounter (Signed)
Pt aware.

## 2011-03-13 ENCOUNTER — Other Ambulatory Visit: Payer: Self-pay | Admitting: Allergy

## 2011-03-13 ENCOUNTER — Ambulatory Visit
Admission: RE | Admit: 2011-03-13 | Discharge: 2011-03-13 | Disposition: A | Discharge status: Home / Self Care | Source: Ambulatory Visit | Attending: Allergy | Admitting: Allergy

## 2011-03-13 DIAGNOSIS — J209 Acute bronchitis, unspecified: Secondary | ICD-10-CM

## 2011-07-08 ENCOUNTER — Other Ambulatory Visit: Payer: Self-pay | Admitting: *Deleted

## 2011-07-08 DIAGNOSIS — F410 Panic disorder [episodic paroxysmal anxiety] without agoraphobia: Secondary | ICD-10-CM

## 2011-07-08 MED ORDER — SERTRALINE HCL 50 MG PO TABS: 50.0000 mg | ORAL_TABLET | Freq: Every day | ORAL | Status: DC

## 2011-07-11 ENCOUNTER — Ambulatory Visit (INDEPENDENT_AMBULATORY_CARE_PROVIDER_SITE_OTHER): Admitting: Family Medicine

## 2011-07-11 ENCOUNTER — Encounter: Payer: Self-pay | Admitting: Family Medicine

## 2011-07-11 ENCOUNTER — Ambulatory Visit
Admission: RE | Admit: 2011-07-11 | Discharge: 2011-07-11 | Disposition: A | Discharge status: Home / Self Care | Source: Ambulatory Visit | Attending: Family Medicine | Admitting: Family Medicine

## 2011-07-11 VITALS — BP 140/88 | Temp 98.4°F | Wt 190.0 lb

## 2011-07-11 DIAGNOSIS — R110 Nausea: Secondary | ICD-10-CM

## 2011-07-11 DIAGNOSIS — R1011 Right upper quadrant pain: Secondary | ICD-10-CM

## 2011-07-11 LAB — CBC WITH DIFFERENTIAL/PLATELET
Basophils Absolute: 0 10*3/uL (ref 0.0–0.1)
Eosinophils Relative: 0.7 % (ref 0.0–5.0)
Lymphs Abs: 1.2 10*3/uL (ref 0.7–4.0)
MCV: 89.7 fl (ref 78.0–100.0)
Monocytes Absolute: 0.4 10*3/uL (ref 0.1–1.0)
Monocytes Relative: 4.8 % (ref 3.0–12.0)
Neutrophils Relative %: 79 % — ABNORMAL HIGH (ref 43.0–77.0)
Platelets: 244 10*3/uL (ref 150.0–400.0)
RDW: 13.8 % (ref 11.5–14.6)
WBC: 7.8 10*3/uL (ref 4.5–10.5)

## 2011-07-11 LAB — BASIC METABOLIC PANEL
BUN: 12 mg/dL (ref 6–23)
CO2: 28 mEq/L (ref 19–32)
GFR: 93.36 mL/min (ref >60.00)
Glucose, Bld: 92 mg/dL (ref 70–99)
Potassium: 4.3 mEq/L (ref 3.5–5.1)
Sodium: 141 mEq/L (ref 135–145)

## 2011-07-11 LAB — HEPATIC FUNCTION PANEL
ALT: 27 U/L (ref 0–35)
Bilirubin, Direct: 0.1 mg/dL (ref 0.0–0.3)
Total Bilirubin: 0.5 mg/dL (ref 0.3–1.2)

## 2011-07-11 NOTE — Patient Instructions (Signed)
Follow up immediately for any fever or worsening symptoms. 

## 2011-07-11 NOTE — Progress Notes (Signed)
  Subjective:    Patient ID: Tracy Strong, female    DOB: [birth year], 51 y.o.   MRN: [id number]  HPI  Onset last Friday of pain which is a dull pain mostly right upper quadrant somewhat epigastric. Nonexertional.  Persistent nausea but no vomiting. Symptoms have been constant. She has some mild dyspnea but denies any real chest pain. No dysphagia.  On Nexium chronically for reflux. No active reflux symptoms. Symptoms are somewhat better with local pressure. Has occasional cough but no fever. No pleuritic pain. Symptoms worse supine and especially worse after eating. No melena. No history of peptic ulcer disease. Patient is nonsmoker. No history of diabetes or hypertension. Denies any recent appetite or weight changes.  Past Medical History  Diagnosis Date  . HYPERLIPIDEMIA 12/02/2008  . DEPRESSION 12/02/2008  . GERD 12/02/2008  . INSOMNIA 12/02/2008  . Headache 12/02/2008  . URINARY INCONTINENCE 12/02/2008  . PREDIABETES 12/02/2008   Past Surgical History  Procedure Date  . Abdominal hysterectomy 2000    partial  . Mohs surgery 2007    nose    reports that she has never smoked. She does not have any smokeless tobacco history on file. Her alcohol and drug histories not on file. family history includes Cancer in her other; Heart disease in her other; and Hypertension in her other. Allergies  Allergen Reactions  . Moxifloxacin     REACTION: syncope episode, panic attacks, Avalox      Review of Systems  Constitutional: Negative for fever, chills and appetite change.  Respiratory: Positive for shortness of breath. Negative for wheezing.   Cardiovascular: Negative for chest pain, palpitations and leg swelling.  Gastrointestinal: Positive for nausea and abdominal pain. Negative for vomiting, diarrhea and constipation.  Genitourinary: Negative for dysuria.  Neurological: Negative for dizziness.       Objective:   Physical Exam  Constitutional: She appears well-developed and  well-nourished.  Neck: Neck supple. No thyromegaly present.  Cardiovascular: Normal rate and regular rhythm.   Pulmonary/Chest: Effort normal and breath sounds normal. No respiratory distress. She has no wheezes. She has no rales.  Abdominal: Soft. She exhibits no mass. There is tenderness. There is no rebound and no guarding.       Tender right upper quadrant to deep palpation. No rebound  Musculoskeletal: She exhibits no edema.  Lymphadenopathy:    She has no cervical adenopathy.          Assessment & Plan:  Right quadrant abdominal pain predominantly. Doubt cardiac. Question symptomatic gallstones. Obtain labs including lipase, CBC, hepatic panel, basic metabolic panel. EKG. Abdominal ultrasound.  EKG obtained b/o her epigastric pain and mild dyspnea, though symptoms are nonexertional, constant, and very atypical.  NSR with no acute changes.

## 2011-07-12 ENCOUNTER — Telehealth: Payer: Self-pay | Admitting: *Deleted

## 2011-07-12 NOTE — Progress Notes (Signed)
Quick Note:  Pt informed ______ 

## 2011-07-12 NOTE — Telephone Encounter (Signed)
Pt. Is asking for lab, and xray results please.   She is still not eating, and in pain.

## 2011-07-12 NOTE — Telephone Encounter (Signed)
Pt was called earlier today with [place] results

## 2011-07-12 NOTE — Telephone Encounter (Signed)
Patient notified of results. She'll try doubling up her Nexium. She takes some Tylenol with some relief. She has some leftover hydrocodone to use as well as needed.

## 2011-07-18 ENCOUNTER — Telehealth: Payer: Self-pay | Admitting: Family Medicine

## 2011-07-18 NOTE — Telephone Encounter (Signed)
Pt was in last Friday and is still bloated after she eats even with the increase in nexium, pain is no longer intense. Pt requesting to be called

## 2011-07-19 NOTE — Telephone Encounter (Signed)
LMTCB with additional information and concern, how can we help

## 2011-07-25 ENCOUNTER — Ambulatory Visit (INDEPENDENT_AMBULATORY_CARE_PROVIDER_SITE_OTHER): Admitting: Family Medicine

## 2011-07-25 ENCOUNTER — Encounter: Payer: Self-pay | Admitting: Family Medicine

## 2011-07-25 VITALS — BP 130/82 | Temp 98.7°F | Wt 194.0 lb

## 2011-07-25 DIAGNOSIS — F410 Panic disorder [episodic paroxysmal anxiety] without agoraphobia: Secondary | ICD-10-CM

## 2011-07-25 DIAGNOSIS — R1011 Right upper quadrant pain: Secondary | ICD-10-CM

## 2011-07-25 DIAGNOSIS — F329 Major depressive disorder, single episode, unspecified: Secondary | ICD-10-CM

## 2011-07-25 DIAGNOSIS — G4700 Insomnia, unspecified: Secondary | ICD-10-CM

## 2011-07-25 MED ORDER — ZOLPIDEM TARTRATE 5 MG PO TABS: 5.0000 mg | ORAL_TABLET | Freq: Every evening | ORAL | Status: DC | PRN

## 2011-07-25 MED ORDER — SERTRALINE HCL 50 MG PO TABS: 50.0000 mg | ORAL_TABLET | Freq: Every day | ORAL | Status: DC

## 2011-07-25 NOTE — Progress Notes (Addendum)
  Subjective:    Patient ID: Tracy Strong, female    DOB: 03/14/1961, 51 y.o.   MRN: [id number]  HPI  Patient sent for followup. Recent abdominal pain mostly right upper quadrant. Lab work unremarkable. Ultrasound revealed incidental cyst of the spleen and liver but no evidence for a gallbladder issues or other abnormality. We had her double up Nexium and she has reduced caffeine intake and symptoms have improved somewhat. She still has episodic right upper quadrant pain usually worse after eating. No longer worse with supine position. No vomiting or nausea. She's gained a few pounds of weight. No appetite change. No stool changes.  Patient history recurrent depression. Requesting refill sertraline. Depression stable. No suicidal ideation.  Chronic insomnia. Requesting refills of Ambien. Takes 5 mg each bedtime. We've discussed sleep hygiene previously.  Past Medical History  Diagnosis Date  . HYPERLIPIDEMIA 12/02/2008  . DEPRESSION 12/02/2008  . GERD 12/02/2008  . INSOMNIA 12/02/2008  . Headache 12/02/2008  . URINARY INCONTINENCE 12/02/2008  . PREDIABETES 12/02/2008   Past Surgical History  Procedure Date  . Abdominal hysterectomy 2000    partial  . Mohs surgery 2007    nose    reports that she has never smoked. She does not have any smokeless tobacco history on file. Her alcohol and drug histories not on file. family history includes Cancer in her other; Heart disease in her other; and Hypertension in her other. Allergies  Allergen Reactions  . Moxifloxacin     REACTION: syncope episode, panic attacks, Avalox      Review of Systems  Constitutional: Negative for fever, chills and fatigue.  Respiratory: Negative for cough and shortness of breath.   Cardiovascular: Negative for chest pain.  Gastrointestinal: Positive for abdominal pain. Negative for nausea, vomiting, diarrhea, constipation and blood in stool.  Genitourinary: Negative for dysuria.  Neurological: Negative for  dizziness.  Hematological: Negative for adenopathy.       Objective:   Physical Exam  Constitutional: She appears well-developed and well-nourished. No distress.  HENT:  Mouth/Throat: Oropharynx is clear and moist.  Neck: Neck supple.  Cardiovascular: Normal rate and regular rhythm.   Pulmonary/Chest: Effort normal and breath sounds normal. No respiratory distress. She has no wheezes. She has no rales.  Abdominal: Soft. Bowel sounds are normal. She exhibits no distension and no mass. There is no tenderness. There is no rebound and no guarding.  Psychiatric: She has a normal mood and affect. Her behavior is normal. Judgment and thought content normal.          Assessment & Plan:  #1 intermittent right upper quadrant abdominal pain. Overall improved. We elected to observe at this point. Continue antireflux measures. Consider HIDA scan if this continues/recurs  #2 depression, recurrent. Stable. Refill sertraline for one year #3 history of chronic insomnia. Refill Ambien.  Persistent RUQ abdominal pain-US negative. Will proceed with HIDA scan.

## 2011-08-06 ENCOUNTER — Telehealth: Payer: Self-pay | Admitting: Family Medicine

## 2011-08-06 NOTE — Telephone Encounter (Signed)
Pt called and said that she's still having abd pains. Thinks it may be gallbladder. Pt already had ultrasound done and test came back normal. Pt is wondering what the next step is?

## 2011-08-06 NOTE — Telephone Encounter (Signed)
As discussed with patient, I will order HIDA scan and if that is positive I will proceed with surgical referral.

## 2011-08-06 NOTE — Progress Notes (Signed)
Addended by: Kristian Covey on: 08/06/2011 08:56 PM   Modules accepted: Orders

## 2011-08-08 ENCOUNTER — Other Ambulatory Visit: Payer: Self-pay | Admitting: *Deleted

## 2011-08-12 ENCOUNTER — Encounter (HOSPITAL_COMMUNITY)
Admission: RE | Admit: 2011-08-12 | Discharge: 2011-08-12 | Disposition: A | Discharge status: Home / Self Care | Source: Ambulatory Visit | Attending: Family Medicine | Admitting: Family Medicine

## 2011-08-12 DIAGNOSIS — R109 Unspecified abdominal pain: Secondary | ICD-10-CM | POA: Insufficient documentation

## 2011-08-12 MED ORDER — SINCALIDE 5 MCG IJ SOLR
INTRAMUSCULAR | Status: AC
  Filled 2011-08-12: qty 5

## 2011-08-12 MED ORDER — TECHNETIUM TC 99M MEBROFENIN IV KIT
5.0000 | PACK | Freq: Once | INTRAVENOUS | Status: AC | PRN
  Administered 2011-08-12: 5 via INTRAVENOUS

## 2011-08-12 MED ORDER — SINCALIDE 5 MCG IJ SOLR
0.0200 ug/kg | Freq: Once | INTRAMUSCULAR | Status: AC
  Administered 2011-08-12: 1.73 ug via INTRAVENOUS

## 2011-10-17 ENCOUNTER — Encounter: Payer: Self-pay | Admitting: Family Medicine

## 2011-10-17 ENCOUNTER — Ambulatory Visit (INDEPENDENT_AMBULATORY_CARE_PROVIDER_SITE_OTHER): Admitting: Family Medicine

## 2011-10-17 VITALS — BP 110/78 | Temp 98.0°F | Wt 191.0 lb

## 2011-10-17 DIAGNOSIS — R1310 Dysphagia, unspecified: Secondary | ICD-10-CM

## 2011-10-17 DIAGNOSIS — K219 Gastro-esophageal reflux disease without esophagitis: Secondary | ICD-10-CM

## 2011-10-17 NOTE — Progress Notes (Signed)
  Subjective:    Patient ID: Tracy Strong, female    DOB: 1961/02/15, 51 y.o.   MRN: [id number]  HPI  Patient's had several months of GERD-type symptoms. Initially controlled with Nexium. She's recently had some issues with dysphagia. She's had good appetite no weight change. Sensation of globus mid esophageal region. Never had to stop eating but feels that food is going down slowly at times. Very little caffeine use. Symptoms becoming more continuous.  Rare alcohol use. No nonsteroidal use. Occasional epigastric pain but most of this is substernal. Symptoms worse with lying supine. Generally bland diet. No nausea or vomiting. No melena.   Review of Systems  Constitutional: Negative for appetite change and unexpected weight change.  HENT: Positive for trouble swallowing. Negative for sore throat.   Cardiovascular: Negative for chest pain.  Gastrointestinal: Negative for nausea, vomiting, blood in stool and abdominal distention.       Objective:   Physical Exam  Constitutional: She appears well-developed and well-nourished.  Cardiovascular: Normal rate and regular rhythm.   Pulmonary/Chest: Effort normal and breath sounds normal. No respiratory distress. She has no wheezes. She has no rales.  Abdominal:       Minimal epigastric tenderness. No mass. No guarding or rebound          Assessment & Plan:  Progressive dysphagia. History of GERD. Symptoms not improving on Nexium. GI referral for consideration of further testing. No worrisome features such as appetite or weight changes.

## 2011-10-17 NOTE — Patient Instructions (Addendum)
We will call you regarding GI appointment.

## 2011-10-24 ENCOUNTER — Ambulatory Visit: Admitting: Gastroenterology

## 2011-11-22 ENCOUNTER — Other Ambulatory Visit: Payer: Self-pay | Admitting: Family Medicine

## 2011-11-22 MED ORDER — ROSUVASTATIN CALCIUM 10 MG PO TABS: 10.0000 mg | ORAL_TABLET | Freq: Every day | ORAL | Status: DC

## 2011-11-22 NOTE — Telephone Encounter (Signed)
Pt would crestor 10 mg #30 [facility] [place]. Pt is waiting on mailorder

## 2011-11-27 ENCOUNTER — Ambulatory Visit: Admitting: Gastroenterology

## 2011-11-29 ENCOUNTER — Ambulatory Visit (INDEPENDENT_AMBULATORY_CARE_PROVIDER_SITE_OTHER): Admitting: Family Medicine

## 2011-11-29 ENCOUNTER — Encounter: Payer: Self-pay | Admitting: Family Medicine

## 2011-11-29 VITALS — BP 110/78 | Temp 98.1°F | Wt 188.0 lb

## 2011-11-29 DIAGNOSIS — R109 Unspecified abdominal pain: Secondary | ICD-10-CM

## 2011-11-29 DIAGNOSIS — F329 Major depressive disorder, single episode, unspecified: Secondary | ICD-10-CM

## 2011-11-29 DIAGNOSIS — R5381 Other malaise: Secondary | ICD-10-CM

## 2011-11-29 DIAGNOSIS — F410 Panic disorder [episodic paroxysmal anxiety] without agoraphobia: Secondary | ICD-10-CM

## 2011-11-29 DIAGNOSIS — E785 Hyperlipidemia, unspecified: Secondary | ICD-10-CM

## 2011-11-29 DIAGNOSIS — K219 Gastro-esophageal reflux disease without esophagitis: Secondary | ICD-10-CM

## 2011-11-29 DIAGNOSIS — R5383 Other fatigue: Secondary | ICD-10-CM

## 2011-11-29 LAB — TSH: TSH: 1.14 u[IU]/mL (ref 0.35–5.50)

## 2011-11-29 MED ORDER — ESOMEPRAZOLE MAGNESIUM 40 MG PO CPDR: 40.0000 mg | DELAYED_RELEASE_CAPSULE | Freq: Every day | ORAL | Status: DC

## 2011-11-29 MED ORDER — SERTRALINE HCL 50 MG PO TABS: 50.0000 mg | ORAL_TABLET | Freq: Every day | ORAL | Status: DC

## 2011-11-29 MED ORDER — ROSUVASTATIN CALCIUM 10 MG PO TABS: 10.0000 mg | ORAL_TABLET | Freq: Every day | ORAL | Status: DC

## 2011-11-29 NOTE — Progress Notes (Signed)
  Subjective:    Patient ID: Tracy Strong, female    DOB: 29-Sep-1960, 51 y.o.   MRN: [id number]  HPI  Medical followup. Patient continues has some intermittent abdominal pains mostly epigastric and right upper quadrant. She had ultrasound which was unremarkable.  Gallbladder study revealed normal ejection fraction. Symptoms are very intermittent. She has sensation of fullness and bloating after eating but no specific food triggers. No nausea or vomiting. No appetite changes. No weight loss. Intermittent constipation. She has questions of lactose intolerance and recently started some Lactaid which seems to help. No family history of celiac disease. No diarrhea issues. No history of anemia.   Hyperlipidemia treated Crestor. Overdue for lipids. No myalgias. No history of CAD. She has history of GERD treated with Nexium. She's tried stopping several times but has had break through symptoms. GERD fairly well-controlled.  History of recurrent depression. Currently stable on sertraline 50 mg daily. Requesting refills. She continues Ambien as needed for sleep  Past Medical History  Diagnosis Date  . HYPERLIPIDEMIA 12/02/2008  . DEPRESSION 12/02/2008  . GERD 12/02/2008  . INSOMNIA 12/02/2008  . Headache 12/02/2008  . URINARY INCONTINENCE 12/02/2008  . PREDIABETES 12/02/2008  . Diverticulosis    Past Surgical History  Procedure Date  . Abdominal hysterectomy 2000    partial  . Mohs surgery 2007    nose    reports that she has never smoked. She does not have any smokeless tobacco history on file. Her alcohol and drug histories not on file. family history includes Cancer in her other; Heart disease in her other; and Hypertension in her other. Allergies  Allergen Reactions  . Moxifloxacin     REACTION: AVALOX syncope episode, panic attacks      Review of Systems  Constitutional: Positive for fatigue. Negative for chills, appetite change and unexpected weight change.  Respiratory: Negative for  cough and shortness of breath.   Cardiovascular: Negative for chest pain, palpitations and leg swelling.  Gastrointestinal: Positive for abdominal pain, constipation and abdominal distention. Negative for nausea, vomiting, diarrhea and blood in stool.  Genitourinary: Negative for dysuria.       Objective:   Physical Exam  Constitutional: She appears well-developed and well-nourished.  HENT:  Mouth/Throat: Oropharynx is clear and moist.  Neck: Neck supple. No thyromegaly present.  Cardiovascular: Normal rate and regular rhythm.   No murmur heard. Pulmonary/Chest: Effort normal and breath sounds normal. No respiratory distress. She has no wheezes. She has no rales.  Abdominal: Soft. Bowel sounds are normal. She exhibits no distension and no mass. There is no tenderness. There is no rebound and no guarding.  Musculoskeletal: She exhibits no edema.          Assessment & Plan:  #1 abdominal pain. Intermittent. Recent studies above negative. We discussed possible GI referral for possible EGD and patient is reluctant. She'll continue lactose-free. Check tissue transglutaminase. #2 hyperlipidemia. Check lipid and hepatic panel  #3 recurrent depression. Stable. Refill sertraline for one year  #4 history of GERD. Refill Nexium

## 2011-12-02 LAB — LIPID PANEL
HDL: 67.7 mg/dL (ref >39.00)
VLDL: 27 mg/dL (ref 0.0–40.0)

## 2011-12-02 LAB — HEPATIC FUNCTION PANEL
Bilirubin, Direct: 0 mg/dL (ref 0.0–0.3)
Total Bilirubin: 0.8 mg/dL (ref 0.3–1.2)

## 2011-12-02 LAB — LDL CHOLESTEROL, DIRECT: Direct LDL: 167.5 mg/dL

## 2011-12-03 ENCOUNTER — Other Ambulatory Visit: Payer: Self-pay | Admitting: *Deleted

## 2011-12-03 MED ORDER — ROSUVASTATIN CALCIUM 20 MG PO TABS: 20.0000 mg | ORAL_TABLET | Freq: Every day | ORAL | Status: DC

## 2011-12-03 NOTE — Progress Notes (Signed)
Quick Note:  Pt informed, pt requested new Crestor Rx for her mail order ______

## 2011-12-19 ENCOUNTER — Other Ambulatory Visit: Payer: Self-pay | Admitting: Orthopaedic Surgery

## 2011-12-20 ENCOUNTER — Ambulatory Visit (INDEPENDENT_AMBULATORY_CARE_PROVIDER_SITE_OTHER): Admitting: Family Medicine

## 2011-12-20 ENCOUNTER — Encounter: Payer: Self-pay | Admitting: Family Medicine

## 2011-12-20 ENCOUNTER — Telehealth: Payer: Self-pay | Admitting: Family Medicine

## 2011-12-20 VITALS — BP 120/80 | Temp 98.3°F | Wt 190.0 lb

## 2011-12-20 DIAGNOSIS — R21 Rash and other nonspecific skin eruption: Secondary | ICD-10-CM

## 2011-12-20 NOTE — Patient Instructions (Addendum)
Follow up promptly for any fever or worsening rash. 

## 2011-12-20 NOTE — Telephone Encounter (Signed)
Pt was in to see Dr Caryl Never about a month ago. Pt having recurring pain on the rt side. Pt has noticed a rash on rt hip and is having back pain on rt side. Pt wondering if this could be shingles? Pls call.

## 2011-12-20 NOTE — Telephone Encounter (Signed)
Per Dr Caryl Never ok to double book 11:45 today.  Pt aware

## 2011-12-20 NOTE — Progress Notes (Signed)
  Subjective:    Patient ID: Tracy Strong, female    DOB: 1960-12-22, 51 y.o.   MRN: [id number]  HPI  Acute visit. Skin rash right lateral hip. Noted about 5 days ago. Mostly pruritic. Minimal discomfort. No significant pain. She's not tried anything topically. No exacerbating factors. No associated symptoms such as fever. Patient was worried about possible shingles.  Review of Systems  Constitutional: Negative for fever and chills.  Skin: Positive for rash.       Objective:   Physical Exam  Constitutional: She appears well-developed and well-nourished.  Cardiovascular: Normal rate and regular rhythm.   Pulmonary/Chest: Effort normal and breath sounds normal. No respiratory distress. She has no wheezes. She has no rales.  Skin:       Right upper thigh region lateral hip region reveals an area of erythema which blanches with pressure about 3 x 3 cm. No vesicles. No pustules. Nontender. Nonscaly          Assessment & Plan:  Nonspecific rash right lateral hip region. Not clear that this represents shingles-very doubtful. Reassurance.

## 2011-12-26 ENCOUNTER — Telehealth: Payer: Self-pay | Admitting: Gastroenterology

## 2011-12-26 NOTE — Telephone Encounter (Signed)
Message copied by Arna Snipe on Thu Dec 26, 2011  8:34 AM ------      Message from: Jessee Avers      Created: Thu Nov 28, 2011  1:16 PM       Bill patient per Dr. Russella Dar

## 2012-01-02 ENCOUNTER — Encounter (HOSPITAL_BASED_OUTPATIENT_CLINIC_OR_DEPARTMENT_OTHER): Payer: Self-pay | Admitting: *Deleted

## 2012-01-02 NOTE — Progress Notes (Signed)
No cardiac or resp problems-has had shoulder surg in past.

## 2012-01-03 NOTE — H&P (Signed)
Tracy Strong is an 51 y.o. female.   Chief Complaint: left shoulder pain HPI: Tracy Strong is a 51 year old well known patient to our practice complaining of multiple months of left shoulder pain with increased activity.  She is having pain when she uses her arm at shoulder level height or higher.  She has had a steroid injection which helped her temporarily.  Over the last couple months she's had increasing discomfort and pain and she reports pain with reaching up and reaching behind her.  She feels weak and is having burning issues in the shoulder as well.  She is also having trouble sleeping at nighttime comfortably.  Recent ultrasound done on 12/05/11 reveals osteophyte formation at the a.c. Joint and significant impingement findings for rotator cuff tendinitis.  We have discussed proceeding with a left shoulder arthroscopy for subacromial decompression and a.c. Joint resection.  Past Medical History  Diagnosis Date  . HYPERLIPIDEMIA 12/02/2008  . DEPRESSION 12/02/2008  . GERD 12/02/2008  . INSOMNIA 12/02/2008  . Headache 12/02/2008  . URINARY INCONTINENCE 12/02/2008  . PREDIABETES 12/02/2008  . Diverticulosis   . Asthma   . Arthritis   . Complication of anesthesia     took awhile waking up  . Contact lens/glasses fitting     wears glasses or contacts    Past Surgical History  Procedure Date  . Abdominal hysterectomy 2000    partial  . Mohs surgery 2007    nose  . Shoulder arthroscopy 2010    right  . Colonoscopy     Family History  Problem Relation Age of Onset  . Hypertension Other   . Cancer Other     lung, prostate  . Heart disease Other    Social History:  reports that she has never smoked. She does not have any smokeless tobacco history on file. She reports that she drinks alcohol. Her drug history not on file.  Allergies:  Allergies  Allergen Reactions  . Moxifloxacin     REACTION: AVALOX syncope episode, panic attacks    No prescriptions prior to admission    No  results found for this or any previous visit (from the past 48 hour(s)). No results found.  Review of Systems  Constitutional: Negative.   HENT: Negative.   Eyes: Negative.   Respiratory: Negative.   Cardiovascular:       Positive for hypertension  Gastrointestinal: Negative.   Genitourinary: Negative.   Musculoskeletal: Negative.   Skin: Negative.   Neurological: Negative.   Endo/Heme/Allergies: Negative.   Psychiatric/Behavioral: Negative.     There were no vitals taken for this visit. Physical Exam  Constitutional: She is oriented to person, place, and time. She appears well-nourished.  HENT:  Head: Atraumatic.  Eyes: EOM are normal.  Neck: Neck supple.  Cardiovascular: Normal rate and regular rhythm.   Respiratory: Effort normal.  GI: Soft.  Musculoskeletal:       Left shoulder exam moderate pain with a.c. Joint palpation and cross chest testing.  Positive primary and secondary impingement.  Positive empty cans test.  5 over 5 rotator cuff strength.  Good neurovascular status to both upper extremities  Neurological: She is oriented to person, place, and time.  Skin: Skin is warm.  Psychiatric: She has a normal mood and affect.     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 Joint resection to help alleviate her pain and improve her function.  We have discussed the risk of anesthesia, infection associated with shoulder arthroscopy.  She will be in a sling postop and most likely will need physical therapy to regain full function and pain relief.  Estus Krakowski R 01/03/2012, 4:50 PM

## 2012-01-07 ENCOUNTER — Ambulatory Visit (HOSPITAL_BASED_OUTPATIENT_CLINIC_OR_DEPARTMENT_OTHER)
Admission: RE | Admit: 2012-01-07 | Discharge: 2012-01-07 | Disposition: A | Discharge status: Home / Self Care | Source: Ambulatory Visit | Attending: Orthopaedic Surgery | Admitting: Orthopaedic Surgery

## 2012-01-07 ENCOUNTER — Encounter (HOSPITAL_BASED_OUTPATIENT_CLINIC_OR_DEPARTMENT_OTHER): Payer: Self-pay | Admitting: *Deleted

## 2012-01-07 ENCOUNTER — Ambulatory Visit (HOSPITAL_BASED_OUTPATIENT_CLINIC_OR_DEPARTMENT_OTHER): Admitting: *Deleted

## 2012-01-07 ENCOUNTER — Encounter (HOSPITAL_BASED_OUTPATIENT_CLINIC_OR_DEPARTMENT_OTHER)
Admission: RE | Disposition: A | Discharge status: Home / Self Care | Payer: Self-pay | Source: Ambulatory Visit | Attending: Orthopaedic Surgery

## 2012-01-07 DIAGNOSIS — M75110 Incomplete rotator cuff tear or rupture of unspecified shoulder, not specified as traumatic: Secondary | ICD-10-CM | POA: Insufficient documentation

## 2012-01-07 DIAGNOSIS — M19019 Primary osteoarthritis, unspecified shoulder: Secondary | ICD-10-CM | POA: Insufficient documentation

## 2012-01-07 DIAGNOSIS — M25819 Other specified joint disorders, unspecified shoulder: Secondary | ICD-10-CM | POA: Insufficient documentation

## 2012-01-07 HISTORY — DX: Other complications of anesthesia, initial encounter: T88.59XA

## 2012-01-07 HISTORY — DX: Encounter for fitting and adjustment of spectacles and contact lenses: Z46.0

## 2012-01-07 HISTORY — DX: Unspecified osteoarthritis, unspecified site: M19.90

## 2012-01-07 HISTORY — DX: Adverse effect of unspecified anesthetic, initial encounter: T41.45XA

## 2012-01-07 HISTORY — PX: SHOULDER ARTHROSCOPY: SHX128

## 2012-01-07 HISTORY — DX: Unspecified asthma, uncomplicated: J45.909

## 2012-01-07 SURGERY — ARTHROSCOPY, SHOULDER
Anesthesia: General | Site: Shoulder | Laterality: Left | Wound class: Clean

## 2012-01-07 MED ORDER — LIDOCAINE HCL 4 % MT SOLN
OROMUCOSAL | Status: DC | PRN
  Administered 2012-01-07: 2 mL via TOPICAL

## 2012-01-07 MED ORDER — SUCCINYLCHOLINE CHLORIDE 20 MG/ML IJ SOLN
INTRAMUSCULAR | Status: DC | PRN
  Administered 2012-01-07: 140 mg via INTRAVENOUS

## 2012-01-07 MED ORDER — PROPOFOL 10 MG/ML IV BOLUS: INTRAVENOUS | Status: DC | PRN

## 2012-01-07 MED ORDER — OXYCODONE HCL 5 MG/5ML PO SOLN: 5.0000 mg | Freq: Once | ORAL | Status: DC | PRN

## 2012-01-07 MED ORDER — CHLORHEXIDINE GLUCONATE 4 % EX LIQD: 60.0000 mL | Freq: Once | CUTANEOUS | Status: DC

## 2012-01-07 MED ORDER — BUPIVACAINE-EPINEPHRINE PF 0.5-1:200000 % IJ SOLN
INTRAMUSCULAR | Status: DC | PRN
  Administered 2012-01-07: 30 mL

## 2012-01-07 MED ORDER — HYDROMORPHONE HCL PF 1 MG/ML IJ SOLN: 0.2500 mg | INTRAMUSCULAR | Status: DC | PRN

## 2012-01-07 MED ORDER — ONDANSETRON HCL 4 MG/2ML IJ SOLN
INTRAMUSCULAR | Status: DC | PRN
  Administered 2012-01-07: 4 mg via INTRAVENOUS

## 2012-01-07 MED ORDER — CEFAZOLIN SODIUM-DEXTROSE 2-3 GM-% IV SOLR
INTRAVENOUS | Status: DC | PRN
  Administered 2012-01-07: 2 g via INTRAVENOUS

## 2012-01-07 MED ORDER — LACTATED RINGERS IV SOLN
INTRAVENOUS | Status: DC
  Administered 2012-01-07: 09:00:00 via INTRAVENOUS

## 2012-01-07 MED ORDER — DEXAMETHASONE SODIUM PHOSPHATE 4 MG/ML IJ SOLN
INTRAMUSCULAR | Status: DC | PRN
  Administered 2012-01-07: 10 mg via INTRAVENOUS

## 2012-01-07 MED ORDER — FENTANYL CITRATE 0.05 MG/ML IJ SOLN
50.0000 ug | INTRAMUSCULAR | Status: DC | PRN
  Administered 2012-01-07: 100 ug via INTRAVENOUS

## 2012-01-07 MED ORDER — LIDOCAINE HCL (CARDIAC) 20 MG/ML IV SOLN
INTRAVENOUS | Status: DC | PRN
  Administered 2012-01-07: 75 mg via INTRAVENOUS

## 2012-01-07 MED ORDER — ONDANSETRON HCL 4 MG/2ML IJ SOLN: 4.0000 mg | Freq: Four times a day (QID) | INTRAMUSCULAR | Status: DC | PRN

## 2012-01-07 MED ORDER — LACTATED RINGERS IV SOLN
INTRAVENOUS | Status: DC
  Administered 2012-01-07 (×2): via INTRAVENOUS

## 2012-01-07 MED ORDER — PROPOFOL 10 MG/ML IV BOLUS
INTRAVENOUS | Status: DC | PRN
  Administered 2012-01-07: 200 mg via INTRAVENOUS

## 2012-01-07 MED ORDER — MIDAZOLAM HCL 2 MG/2ML IJ SOLN
1.0000 mg | INTRAMUSCULAR | Status: DC | PRN
  Administered 2012-01-07: 2 mg via INTRAVENOUS

## 2012-01-07 MED ORDER — FENTANYL CITRATE 0.05 MG/ML IJ SOLN
INTRAMUSCULAR | Status: DC | PRN
  Administered 2012-01-07: 50 ug via INTRAVENOUS

## 2012-01-07 MED ORDER — OXYCODONE HCL 5 MG PO TABS: 5.0000 mg | ORAL_TABLET | Freq: Once | ORAL | Status: DC | PRN

## 2012-01-07 MED ORDER — HYDROCODONE-ACETAMINOPHEN 5-500 MG PO TABS: 1.0000 | ORAL_TABLET | Freq: Four times a day (QID) | ORAL | Status: DC | PRN

## 2012-01-07 SURGICAL SUPPLY — 72 items
ADH SKN CLS APL DERMABOND .7 (GAUZE/BANDAGES/DRESSINGS)
APL SKNCLS STERI-STRIP NONHPOA (GAUZE/BANDAGES/DRESSINGS)
BENZOIN TINCTURE PRP APPL 2/3 (GAUZE/BANDAGES/DRESSINGS) IMPLANT
BLADE CUDA 5.5 (BLADE) IMPLANT
BLADE GREAT WHITE 4.2 (BLADE) ×2 IMPLANT
BLADE SURG 15 STRL LF DISP TIS (BLADE) IMPLANT
BLADE SURG 15 STRL SS (BLADE)
BUR VERTEX HOODED 4.5 (BURR) ×1 IMPLANT
CANISTER OMNI JUG 16 LITER (MISCELLANEOUS) ×2 IMPLANT
CANISTER SUCTION 2500CC (MISCELLANEOUS) IMPLANT
CANNULA SHOULDER 7CM (CANNULA) ×2 IMPLANT
CANNULA TWIST IN 8.25X7CM (CANNULA) IMPLANT
DECANTER SPIKE VIAL GLASS SM (MISCELLANEOUS) IMPLANT
DERMABOND ADVANCED (GAUZE/BANDAGES/DRESSINGS)
DERMABOND ADVANCED .7 DNX12 (GAUZE/BANDAGES/DRESSINGS) IMPLANT
DRAPE STERI 35X30 U-POUCH (DRAPES) ×2 IMPLANT
DRAPE U-SHAPE 47X51 STRL (DRAPES) ×2 IMPLANT
DRAPE U-SHAPE 76X120 STRL (DRAPES) ×4 IMPLANT
DRSG EMULSION OIL 3X3 NADH (GAUZE/BANDAGES/DRESSINGS) ×2 IMPLANT
DRSG PAD ABDOMINAL 8X10 ST (GAUZE/BANDAGES/DRESSINGS) ×2 IMPLANT
DURAPREP 26ML APPLICATOR (WOUND CARE) ×2 IMPLANT
ELECT MENISCUS 165MM 90D (ELECTRODE) IMPLANT
ELECT REM PT RETURN 9FT ADLT (ELECTROSURGICAL)
ELECTRODE REM PT RTRN 9FT ADLT (ELECTROSURGICAL) ×1 IMPLANT
GLOVE BIO SURGEON STRL SZ 6.5 (GLOVE) ×1 IMPLANT
GLOVE BIO SURGEON STRL SZ8.5 (GLOVE) ×1 IMPLANT
GLOVE BIOGEL PI IND STRL 7.0 (GLOVE) IMPLANT
GLOVE BIOGEL PI IND STRL 8 (GLOVE) ×1 IMPLANT
GLOVE BIOGEL PI IND STRL 8.5 (GLOVE) ×1 IMPLANT
GLOVE BIOGEL PI INDICATOR 7.0 (GLOVE) ×1
GLOVE BIOGEL PI INDICATOR 8 (GLOVE) ×1
GLOVE BIOGEL PI INDICATOR 8.5 (GLOVE)
GLOVE SS BIOGEL STRL SZ 8 (GLOVE) ×1 IMPLANT
GLOVE SUPERSENSE BIOGEL SZ 8 (GLOVE) ×1
GOWN PREVENTION PLUS XLARGE (GOWN DISPOSABLE) ×2 IMPLANT
GOWN PREVENTION PLUS XXLARGE (GOWN DISPOSABLE) ×2 IMPLANT
NDL SCORPION MULTI FIRE (NEEDLE) IMPLANT
NDL SUT 6 .5 CRC .975X.05 MAYO (NEEDLE) IMPLANT
NEEDLE MAYO TAPER (NEEDLE)
NEEDLE SCORPION MULTI FIRE (NEEDLE) IMPLANT
NS IRRIG 1000ML POUR BTL (IV SOLUTION) IMPLANT
PACK ARTHROSCOPY DSU (CUSTOM PROCEDURE TRAY) ×2 IMPLANT
PACK BASIN DAY SURGERY FS (CUSTOM PROCEDURE TRAY) ×2 IMPLANT
PASSER SUT SWANSON 36MM LOOP (INSTRUMENTS) IMPLANT
PENCIL BUTTON HOLSTER BLD 10FT (ELECTRODE) IMPLANT
SET ARTHROSCOPY TUBING (MISCELLANEOUS) ×2
SET ARTHROSCOPY TUBING LN (MISCELLANEOUS) ×1 IMPLANT
SHEET MEDIUM DRAPE 40X70 STRL (DRAPES) ×2 IMPLANT
SLEEVE SCD COMPRESS KNEE MED (MISCELLANEOUS) ×1 IMPLANT
SLING ARM FOAM STRAP LRG (SOFTGOODS) ×1 IMPLANT
SLING ARM FOAM STRAP MED (SOFTGOODS) IMPLANT
SLING ARM FOAM STRAP SML (SOFTGOODS) IMPLANT
SLING ARM FOAM STRAP XLG (SOFTGOODS) IMPLANT
SPONGE GAUZE 4X4 12PLY (GAUZE/BANDAGES/DRESSINGS) ×2 IMPLANT
SPONGE LAP 4X18 X RAY DECT (DISPOSABLE) IMPLANT
STRIP CLOSURE SKIN 1/2X4 (GAUZE/BANDAGES/DRESSINGS) IMPLANT
SUCTION FRAZIER TIP 10 FR DISP (SUCTIONS) IMPLANT
SUT ETHIBOND 2 OS 4 DA (SUTURE) IMPLANT
SUT ETHILON 3 0 PS 1 (SUTURE) ×2 IMPLANT
SUT FIBERWIRE #2 38 T-5 BLUE (SUTURE)
SUT PDS AB 2-0 CT2 27 (SUTURE) IMPLANT
SUT VIC AB 0 SH 27 (SUTURE) IMPLANT
SUT VIC AB 2-0 SH 27 (SUTURE)
SUT VIC AB 2-0 SH 27XBRD (SUTURE) IMPLANT
SUT VICRYL 4-0 PS2 18IN ABS (SUTURE) IMPLANT
SUTURE FIBERWR #2 38 T-5 BLUE (SUTURE) IMPLANT
SYR BULB 3OZ (MISCELLANEOUS) IMPLANT
TOWEL OR 17X24 6PK STRL BLUE (TOWEL DISPOSABLE) ×2 IMPLANT
TOWEL OR NON WOVEN STRL DISP B (DISPOSABLE) ×2 IMPLANT
WAND STAR VAC 90 (SURGICAL WAND) ×2 IMPLANT
WATER STERILE IRR 1000ML POUR (IV SOLUTION) ×2 IMPLANT
YANKAUER SUCT BULB TIP NO VENT (SUCTIONS) IMPLANT

## 2012-01-07 NOTE — Interval H&P Note (Signed)
History and Physical Interval Note:  01/07/2012 9:07 AM  Tracy Strong  has presented today for surgery, with the diagnosis of LEFT SHOULDER IMPINGEMENT AND AC PAIN   The various methods of treatment have been discussed with the patient and family. After consideration of risks, benefits and other options for treatment, the patient has consented to  Procedure(s) (LRB) with comments: ARTHROSCOPY SHOULDER (Left) - LEFT SHOULDER SCOPE as a surgical intervention .  The patient's history has been reviewed, patient examined, no change in status, stable for surgery.  I have reviewed the patient's chart and labs.  Questions were answered to the patient's satisfaction.     Jihan Mellette G

## 2012-01-07 NOTE — Op Note (Signed)
#  [id number] 

## 2012-01-07 NOTE — Anesthesia Postprocedure Evaluation (Signed)
Anesthesia Post Note  Patient: Tracy Strong  Procedure(s) Performed: Procedure(s) (LRB): ARTHROSCOPY SHOULDER (Left)  Anesthesia type: General  Patient location: PACU  Post pain: Pain level controlled and Adequate analgesia  Post assessment: Post-op Vital signs reviewed, Patient's Cardiovascular Status Stable, Respiratory Function Stable, Patent Airway and Pain level controlled  Last Vitals:  Filed Vitals:   01/07/12 1130  BP: 145/82  Pulse: 74  Temp:   Resp: 19    Post vital signs: Reviewed and stable  Level of consciousness: awake, alert  and oriented  Complications: No apparent anesthesia complications

## 2012-01-07 NOTE — Transfer of Care (Signed)
Immediate Anesthesia Transfer of Care Note  Patient: Tracy Strong  Procedure(s) Performed: Procedure(s) (LRB) with comments: ARTHROSCOPY SHOULDER (Left) - LEFT SHOULDER SCOPE with acromioplasty and distal clavicle resection  Patient Location: PACU  Anesthesia Type: GA combined with regional for post-op pain  Level of Consciousness: awake, alert  and oriented  Airway & Oxygen Therapy: Patient Spontanous Breathing and Patient connected to face mask oxygen  Post-op Assessment: Report given to PACU RN, Post -op Vital signs reviewed and stable and Patient moving all extremities  Post vital signs: Reviewed and stable  Complications: No apparent anesthesia complications

## 2012-01-07 NOTE — Progress Notes (Signed)
Assisted Dr. Hodierne with left, ultrasound guided, infraclavicular block. Side rails up, monitors on throughout procedure. See vital signs in flow sheet. Tolerated Procedure well. 

## 2012-01-07 NOTE — Anesthesia Preprocedure Evaluation (Addendum)
Anesthesia Evaluation  Patient identified by MRN, date of birth, ID band Patient awake    Reviewed: Allergy & Precautions, H&P , NPO status , Patient's Chart, lab work & pertinent test results  Airway Mallampati: II  Neck ROM: full    Dental   Pulmonary asthma ,          Cardiovascular     Neuro/Psych  Headaches, Depression    GI/Hepatic GERD-  ,  Endo/Other    Renal/GU      Musculoskeletal  (+) Arthritis -,   Abdominal   Peds  Hematology   Anesthesia Other Findings   Reproductive/Obstetrics                           Anesthesia Physical Anesthesia Plan  ASA: II  Anesthesia Plan: General and Regional   Post-op Pain Management: MAC Combined w/ Regional for Post-op pain   Induction: Intravenous  Airway Management Planned: Oral ETT  Additional Equipment:   Intra-op Plan:   Post-operative Plan: Extubation in OR  Informed Consent: I have reviewed the patients History and Physical, chart, labs and discussed the procedure including the risks, benefits and alternatives for the proposed anesthesia with the patient or authorized representative who has indicated his/her understanding and acceptance.     Plan Discussed with: CRNA and Surgeon  Anesthesia Plan Comments:         Anesthesia Quick Evaluation

## 2012-01-07 NOTE — Anesthesia Procedure Notes (Addendum)
Anesthesia Regional Block:  Interscalene brachial plexus block  Pre-Anesthetic Checklist: ,, timeout performed, Correct Patient, Correct Site, Correct Laterality, Correct Procedure, Correct Position, site marked, Risks and benefits discussed,  Surgical consent,  Pre-op evaluation,  At surgeon's request and post-op pain management  Laterality: Left  Prep: chloraprep       Needles:  Injection technique: Single-shot  Needle Type: Echogenic Stimulator Needle     Needle Length: 5cm 5 cm Needle Gauge: 22 and 22 G    Additional Needles:  Procedures: ultrasound guided and nerve stimulator Interscalene brachial plexus block  Nerve Stimulator or Paresthesia:  Response: biceps flexion, 0.45 mA,   Additional Responses:   Narrative:  Start time: 01/07/2012 8:56 AM End time: 01/07/2012 9:08 AM Injection made incrementally with aspirations every 5 mL.  Performed by: Personally  Anesthesiologist: Dr Chaney Malling  Additional Notes: Functioning IV was confirmed and monitors were applied.  A 50mm 22ga Arrow echogenic stimulator needle was used. Sterile prep and drape,hand hygiene and sterile gloves were used.  Negative aspiration and negative test dose prior to incremental administration of local anesthetic. The patient tolerated the procedure well.  Ultrasound guidance: relevent anatomy identified, needle position confirmed, local anesthetic spread visualized around nerve(s), vascular puncture avoided.  Image printed for medical record.   Interscalene brachial plexus block Procedure Name: Intubation Date/Time: 01/07/2012 9:30 AM Performed by: Meyer Russel Pre-anesthesia Checklist: Patient identified, Emergency Drugs available, Suction available and Patient being monitored Patient Re-evaluated:Patient Re-evaluated prior to inductionOxygen Delivery Method: Circle System Utilized Preoxygenation: Pre-oxygenation with 100% oxygen Intubation Type: IV induction Ventilation: Mask ventilation  without difficulty Laryngoscope Size: Miller and 2 Grade View: Grade II Tube type: Oral Tube size: 7.0 mm Number of attempts: 1 Airway Equipment and Method: stylet and LTA kit utilized Placement Confirmation: ETT inserted through vocal cords under direct vision,  positive ETCO2 and breath sounds checked- equal and bilateral Secured at: 22 cm Tube secured with: Tape Dental Injury: Teeth and Oropharynx as per pre-operative assessment

## 2012-01-08 NOTE — Op Note (Signed)
[name], Tracy Strong                ACCOUNT NO.:  [id number]  MEDICAL RECORD NO.:  [id number]  LOCATION:                                 FACILITY:  PHYSICIAN:  Tracy Strong. Lashunta Strong, M.D.DATE OF BIRTH:  [birth year]  DATE OF PROCEDURE:  01/07/2012 DATE OF DISCHARGE:                              OPERATIVE REPORT   PREOPERATIVE DIAGNOSES: 1. Left shoulder impingement. 2. Left shoulder partial rotator cuff tear. 3. Left shoulder acromioclavicular degeneration.  POSTOPERATIVE DIAGNOSES: 1. Left shoulder impingement. 2. Left shoulder partial rotator cuff tear. 3. Left shoulder acromioclavicular degeneration.  PROCEDURES: 1. Left shoulder arthroscopic acromioplasty. 2. Left shoulder arthroscopic debridement. 3. Left shoulder arthroscopic AC resection.  ANESTHESIA:  General and block.  ATTENDING SURGEON:  Tracy Strong. Jjesus Dingley, MD  INDICATION FOR PROCEDURE:  The patient is a 51 year old woman with long history of left shoulder pain.  This has persisted despite a therapy program and several injections which have afforded her transient relief. By ultrasound study, she has a partial-thickness rotator cuff tear.  By x-ray, she has AC degeneration.  With continued pain with use and at rest, she is offered an arthroscopy.  Informed operative consent was obtained after discussion of possible complications including reaction to anesthesia and infection.  SUMMARY OF FINDINGS AND PROCEDURE:  Under general anesthesia and a block, a left shoulder arthroscopy was performed.  The glenohumeral joint showed no degenerative changes and the biceps tendon and subscapularis tendon looked normal.  The labral structures all looked normal.  She did have some partial thickness tearing of the rotator cuff seen from below and a debridement of about 10% partial thickness tear was done.  In the subacromial space, she had a great deal of bursitis and a thorough bursectomy was done with a debridement but no tear  worthy of repair was found in the rotator cuff.  She had a prominent subacromial morphology, addressed with an acromioplasty back to a flat surface.  She also had a prominence and degeneration at the [facility] joint and a formal AC resection was performed.  She was discharged home the same day.  DESCRIPTION OF PROCEDURE:  The patient was taken to the operating suite where general anesthetic was applied without difficulty.  She was also given a block in the pre-anesthesia area.  She was positioned in beach- chair position and prepped and draped in normal sterile fashion.  After the administration of IV Kefzol and an appropriate time out, an arthroscopy of the left shoulder was performed through total of 3 portals.  Findings were as noted above and procedure consisted mostly of the acromioplasty done with a bur in the lateral position followed by transfer of the bur to the posterior position.  I also performed a formal AC resection with the bur in the anterior position.  The cuff was thoroughly debrided but again no tear worthy of repair was found.  She was irrigated followed by reapproximation of portals loosely with nylon. Adaptic was applied followed by dry gauze and tape.  Estimated blood loss and fluids obtained from anesthesia records.  DISPOSITION:  The patient was extubated in operating room and taken to recovery room in stable condition.  She wished  to go home same-day and follow up in the office closely.  I will contact her by phone tonight.     Tracy Strong [name], M.D.     PGD/MEDQ  D:  01/07/2012  T:  01/08/2012  Job:  [id number]

## 2012-01-09 ENCOUNTER — Encounter (HOSPITAL_BASED_OUTPATIENT_CLINIC_OR_DEPARTMENT_OTHER): Payer: Self-pay | Admitting: Orthopaedic Surgery

## 2012-01-13 ENCOUNTER — Encounter (HOSPITAL_BASED_OUTPATIENT_CLINIC_OR_DEPARTMENT_OTHER): Payer: Self-pay

## 2012-03-04 ENCOUNTER — Ambulatory Visit (INDEPENDENT_AMBULATORY_CARE_PROVIDER_SITE_OTHER): Admitting: Family Medicine

## 2012-03-04 ENCOUNTER — Encounter: Payer: Self-pay | Admitting: Family Medicine

## 2012-03-04 VITALS — BP 120/80 | Temp 98.5°F | Wt 189.0 lb

## 2012-03-04 DIAGNOSIS — E785 Hyperlipidemia, unspecified: Secondary | ICD-10-CM

## 2012-03-04 DIAGNOSIS — Z23 Encounter for immunization: Secondary | ICD-10-CM

## 2012-03-04 LAB — LIPID PANEL
Cholesterol: 186 mg/dL (ref 0–200)
HDL: 67.7 mg/dL (ref >39.00)
LDL Cholesterol: 90 mg/dL (ref 0–99)
VLDL: 28.2 mg/dL (ref 0.0–40.0)

## 2012-03-04 NOTE — Progress Notes (Signed)
  Subjective:    Patient ID: Tracy Strong, female    DOB: 29-Jul-1960, 51 y.o.   MRN: [id number]  HPI  Patient seen for followup hyperlipidemia. Increased Crestor to 20 mg daily last visit. No myalgias. Overall feels well. Depression and anxiety symptoms well controlled on sertraline. Patient needs flu vaccine.     Review of Systems  Respiratory: Negative for cough and shortness of breath.   Cardiovascular: Negative for chest pain.  Musculoskeletal: Negative for myalgias.  Neurological: Negative for dizziness.       Objective:   Physical Exam  Constitutional: She appears well-developed and well-nourished. No distress.  Cardiovascular: Normal rate and regular rhythm.  Exam reveals no gallop.   Pulmonary/Chest: Effort normal and breath sounds normal. No respiratory distress. She has no wheezes. She has no rales.  Musculoskeletal: She exhibits no edema.          Assessment & Plan:  Hyperlipidemia. Recent increased dose Crestor. Recheck lipid panel. Recent hepatic panel normal. Flu vaccine given.

## 2012-03-06 NOTE — Progress Notes (Signed)
Quick Note:  Pt informed ______ 

## 2012-04-27 ENCOUNTER — Telehealth: Payer: Self-pay | Admitting: Family Medicine

## 2012-04-27 NOTE — Telephone Encounter (Signed)
Pt needs refill of zolpidem (AMBIEN) 5 MG tablet. 90 day supply  Pt needs the script faxed to   [facility]  (Meds by Mail) Faxed to [phone]

## 2012-04-30 NOTE — Telephone Encounter (Signed)
I called pt and explained Dr Caryl Never is out of office until 2/12.  I informed her I could call in Ambien #30 to her local pharmacy until his return.  Pt states she has enough to wait for his return.  Pt really wants to use the mail order for cost savings.  Pt is taking Ambien every night, not PRN.

## 2012-05-06 MED ORDER — ZOLPIDEM TARTRATE 5 MG PO TABS: 5.0000 mg | ORAL_TABLET | Freq: Every evening | ORAL | Status: DC | PRN

## 2012-05-06 NOTE — Telephone Encounter (Signed)
Refill for 6 months. 

## 2012-10-20 ENCOUNTER — Ambulatory Visit (INDEPENDENT_AMBULATORY_CARE_PROVIDER_SITE_OTHER): Admitting: Family Medicine

## 2012-10-20 ENCOUNTER — Encounter: Payer: Self-pay | Admitting: Family Medicine

## 2012-10-20 VITALS — BP 130/80 | HR 71 | Temp 98.1°F | Wt 189.0 lb

## 2012-10-20 DIAGNOSIS — E785 Hyperlipidemia, unspecified: Secondary | ICD-10-CM

## 2012-10-20 DIAGNOSIS — R1011 Right upper quadrant pain: Secondary | ICD-10-CM

## 2012-10-20 DIAGNOSIS — G4700 Insomnia, unspecified: Secondary | ICD-10-CM

## 2012-10-20 DIAGNOSIS — K219 Gastro-esophageal reflux disease without esophagitis: Secondary | ICD-10-CM

## 2012-10-20 MED ORDER — ESOMEPRAZOLE MAGNESIUM 40 MG PO CPDR: 40.0000 mg | DELAYED_RELEASE_CAPSULE | Freq: Every day | ORAL | Status: DC

## 2012-10-20 MED ORDER — ROSUVASTATIN CALCIUM 20 MG PO TABS: 20.0000 mg | ORAL_TABLET | Freq: Every day | ORAL | Status: DC

## 2012-10-20 MED ORDER — ZOLPIDEM TARTRATE 5 MG PO TABS: ORAL_TABLET | ORAL | Status: DC

## 2012-10-20 NOTE — Progress Notes (Signed)
Subjective:    Patient ID: Tracy Strong, female    DOB: [birth year], 52 y.o.   MRN: [id number]  HPI Followup regarding some intermittent right upper quadrant abdominal pain. She was seen last year and had workup including ultrasound which was unremarkable. Subsequent HIDA scan revealed pain with CCK infusion but ejection fraction of 64%. We discussed possible GI referral but she never went. She relates episodes about every 4 months usually lasting 4-5 days per episode of intermittent right upper quadrant pain worse after eating. Symptoms tend to be worse at night versus day. She denies any appetite or weight changes. No fevers or chills. No hematemesis. No stool changes. She takes Nexium regularly for GERD symptoms. Those are fairly well controlled.  Patient denies any chest pains. No exertional symptoms. Occasional pain radiation to the back but mostly right upper quadrant.  Patient requesting refills of Crestor. No history of CAD or peripheral vascular disease. Chronic insomnia on Ambien. She currently takes one and one half tablets of 5 mg. She states she had increased hangover drowsiness with 10 mg.  Past Medical History  Diagnosis Date  . HYPERLIPIDEMIA 12/02/2008  . DEPRESSION 12/02/2008  . GERD 12/02/2008  . INSOMNIA 12/02/2008  . Headache(784.0) 12/02/2008  . URINARY INCONTINENCE 12/02/2008  . PREDIABETES 12/02/2008  . Diverticulosis   . Asthma   . Arthritis   . Complication of anesthesia     took awhile waking up  . Contact lens/glasses fitting     wears glasses or contacts   Past Surgical History  Procedure Laterality Date  . Abdominal hysterectomy  2000    partial  . Mohs surgery  2007    nose  . Shoulder arthroscopy  2010    right  . Colonoscopy    . Shoulder arthroscopy  01/07/2012    Procedure: ARTHROSCOPY SHOULDER;  Surgeon: Velna Ochs, MD;  Location: [facility];  Service: Orthopedics;  Laterality: Left;  LEFT SHOULDER SCOPE with acromioplasty  and distal clavicle resection    reports that she has never smoked. She does not have any smokeless tobacco history on file. She reports that  drinks alcohol. Her drug history is not on file. family history includes Cancer in her other; Heart disease in her other; and Hypertension in her other. Allergies  Allergen Reactions  . Moxifloxacin     REACTION: AVALOX syncope episode, panic attacks      Review of Systems  Constitutional: Negative for chills, appetite change, fatigue and unexpected weight change.  Respiratory: Negative for cough and shortness of breath.   Cardiovascular: Negative for chest pain.  Gastrointestinal: Positive for abdominal pain. Negative for nausea, vomiting, diarrhea and constipation.  Endocrine: Negative for polydipsia and polyuria.  Neurological: Negative for dizziness and headaches.  Psychiatric/Behavioral: Positive for sleep disturbance. Negative for dysphoric mood.       Objective:   Physical Exam  Constitutional: She appears well-developed and well-nourished.  Neck: Neck supple. No thyromegaly present.  Cardiovascular: Normal rate and regular rhythm.   Pulmonary/Chest: Effort normal and breath sounds normal. No respiratory distress. She has no wheezes. She has no rales.  Abdominal: Soft. Bowel sounds are normal. She exhibits no distension and no mass. There is no tenderness. There is no rebound and no guarding.  Musculoskeletal: She exhibits no edema.          Assessment & Plan:  #1 intermittent right upper quadrant abdominal pain usually triggered by food intake. Previous studies including ultrasound and HIDA scan negative with  exception of pain with CCK infusion. Currently pain free.  GI referral. #2 hyperlipidemia. Refill Crestor. She'll return in 3 months for lipid #3 insomnia. Chronic. Sleep hygiene discussed. Refill Ambien for 6 months #4 history of GERD. Stable. Refilled Nexium. She has tried leaving off in past several times with  breakthrough symptoms

## 2012-10-22 ENCOUNTER — Encounter: Payer: Self-pay | Admitting: Gastroenterology

## 2012-11-04 ENCOUNTER — Other Ambulatory Visit: Payer: Self-pay | Admitting: Family Medicine

## 2012-11-04 DIAGNOSIS — M25512 Pain in left shoulder: Secondary | ICD-10-CM

## 2012-11-08 ENCOUNTER — Ambulatory Visit
Admission: RE | Admit: 2012-11-08 | Discharge: 2012-11-08 | Disposition: A | Discharge status: Home / Self Care | Source: Ambulatory Visit | Attending: Family Medicine | Admitting: Family Medicine

## 2012-11-08 DIAGNOSIS — M25512 Pain in left shoulder: Secondary | ICD-10-CM

## 2012-11-19 ENCOUNTER — Encounter: Payer: Self-pay | Admitting: Gastroenterology

## 2012-11-19 ENCOUNTER — Ambulatory Visit (INDEPENDENT_AMBULATORY_CARE_PROVIDER_SITE_OTHER): Admitting: Gastroenterology

## 2012-11-19 VITALS — BP 118/80 | HR 80 | Ht 65.0 in | Wt 190.0 lb

## 2012-11-19 DIAGNOSIS — R141 Gas pain: Secondary | ICD-10-CM

## 2012-11-19 DIAGNOSIS — R1013 Epigastric pain: Secondary | ICD-10-CM

## 2012-11-19 DIAGNOSIS — K219 Gastro-esophageal reflux disease without esophagitis: Secondary | ICD-10-CM

## 2012-11-19 DIAGNOSIS — R140 Abdominal distension (gaseous): Secondary | ICD-10-CM

## 2012-11-19 MED ORDER — HYOSCYAMINE SULFATE 0.125 MG SL SUBL: SUBLINGUAL_TABLET | SUBLINGUAL | Status: DC

## 2012-11-19 NOTE — Patient Instructions (Addendum)
We have sent the following medications to your pharmacy for you to pick up at your convenience: Levsin.  You have been scheduled for an endoscopy with propofol. Please follow written instructions given to you at your visit today. If you use inhalers (even only as needed), please bring them with you on the day of your procedure. Your physician has requested that you go to www.startemmi.com and enter the access code given to you at your visit today. This web site gives a general overview about your procedure. However, you should still follow specific instructions given to you by our office regarding your preparation for the procedure.  Thank you for choosing me and [facility].  Venita Lick. Pleas Koch., MD., [facility]

## 2012-11-19 NOTE — Progress Notes (Signed)
History of Present Illness: This is a 52 year old female who relates intermittent episodes of epigastric pain radiating to both upper quadrants associated with bloating for the past 3 years. She has chronic GERD which has been well controlled on daily Nexium. Generally she is symptom-free she may have symptoms once every several months and they will last for 1-5 days at time. She tried taking extra Nexium for these episodes but that has not generally improved her symptoms. She frequently notes diarrhea with symptoms. Colonoscopy in September 2011 showed only diverticulosis. She underwent an abdominal ultrasound and CCK HIDA last year that did not show any evidence of biliary disease. Denies weight loss, constipation, change in stool caliber, melena, hematochezia, nausea, vomiting, dysphagia, chest pain.  Current Medications, Allergies, Past Medical History, Past Surgical History, Family History and Social History were reviewed in [employer] record.  Physical Exam: General: Well developed , well nourished, no acute distress Head: Normocephalic and atraumatic Eyes:  sclerae anicteric, EOMI Ears: Normal auditory acuity Mouth: No deformity or lesions Lungs: Clear throughout to auscultation Heart: Regular rate and rhythm; no murmurs, rubs or bruits Abdomen: Soft, non tender and non distended. No masses, hepatosplenomegaly or hernias noted. Normal Bowel sounds Musculoskeletal: Symmetrical with no gross deformities  Pulses:  Normal pulses noted Extremities: No clubbing, cyanosis, edema or deformities noted Neurological: Alert oriented x 4, grossly nonfocal Psychological:  Alert and cooperative. Normal mood and affect  Assessment and Recommendations:  1. Episodic epigastric pain and bloating radiating to both upper quadrants associated with diarrhea. Chronic GERD. Possible reflux flares, gastritis, or irritable bowel syndrome. Trial of hyoscyamine 1-2 every 4 hours. Continue  Nexium 40 mg daily and standard antireflux measures. Schedule upper endoscopy. The risks, benefits, and alternatives to endoscopy with possible biopsy and possible dilation were discussed with the patient and they consent to proceed.

## 2012-11-26 ENCOUNTER — Encounter: Payer: Self-pay | Admitting: Gastroenterology

## 2012-11-26 ENCOUNTER — Ambulatory Visit (AMBULATORY_SURGERY_CENTER): Admitting: Gastroenterology

## 2012-11-26 VITALS — BP 143/74 | HR 61 | Temp 96.4°F | Resp 15 | Ht 65.0 in | Wt 190.0 lb

## 2012-11-26 DIAGNOSIS — K219 Gastro-esophageal reflux disease without esophagitis: Secondary | ICD-10-CM

## 2012-11-26 DIAGNOSIS — R1013 Epigastric pain: Secondary | ICD-10-CM

## 2012-11-26 MED ORDER — SODIUM CHLORIDE 0.9 % IV SOLN: 500.0000 mL | INTRAVENOUS | Status: DC

## 2012-11-26 NOTE — Patient Instructions (Addendum)
Discharge instructions given with verbal understanding. Normal exam. Resume previous medications. YOU HAD AN ENDOSCOPIC PROCEDURE TODAY AT THE [facility]: Refer to the procedure report that was given to you for any specific questions about what was found during the examination.  If the procedure report does not answer your questions, please call your gastroenterologist to clarify.  If you requested that your care partner not be given the details of your procedure findings, then the procedure report has been included in a sealed envelope for you to review at your convenience later.  YOU SHOULD EXPECT: Some feelings of bloating in the abdomen. Passage of more gas than usual.  Walking can help get rid of the air that was put into your GI tract during the procedure and reduce the bloating. If you had a lower endoscopy (such as a colonoscopy or flexible sigmoidoscopy) you may notice spotting of blood in your stool or on the toilet paper. If you underwent a bowel prep for your procedure, then you may not have a normal bowel movement for a few days.  DIET: Your first meal following the procedure should be a light meal and then it is ok to progress to your normal diet.  A half-sandwich or bowl of soup is an example of a good first meal.  Heavy or fried foods are harder to digest and may make you feel nauseous or bloated.  Likewise meals heavy in dairy and vegetables can cause extra gas to form and this can also increase the bloating.  Drink plenty of fluids but you should avoid alcoholic beverages for 24 hours.  ACTIVITY: Your care partner should take you home directly after the procedure.  You should plan to take it easy, moving slowly for the rest of the day.  You can resume normal activity the day after the procedure however you should NOT DRIVE or use heavy machinery for 24 hours (because of the sedation medicines used during the test).    SYMPTOMS TO REPORT IMMEDIATELY: A gastroenterologist  can be reached at any hour.  During normal business hours, 8:30 AM to 5:00 PM Monday through Friday, call [phone].  After hours and on weekends, please call the GI answering service at [phone] who will take a message and have the physician on call contact you.   Following upper endoscopy (EGD)  Vomiting of blood or coffee ground material  New chest pain or pain under the shoulder blades  Painful or persistently difficult swallowing  New shortness of breath  Fever of 100F or higher  Black, tarry-looking stools  FOLLOW UP: If any biopsies were taken you will be contacted by phone or by letter within the next 1-3 weeks.  Call your gastroenterologist if you have not heard about the biopsies in 3 weeks.  Our staff will call the home number listed on your records the next business day following your procedure to check on you and address any questions or concerns that you may have at that time regarding the information given to you following your procedure. This is a courtesy call and so if there is no answer at the home number and we have not heard from you through the emergency physician on call, we will assume that you have returned to your regular daily activities without incident.  SIGNATURES/CONFIDENTIALITY: You and/or your care partner have signed paperwork which will be entered into your electronic medical record.  These signatures attest to the fact that that the information above on your After   Visit Summary has been reviewed and is understood.  Full responsibility of the confidentiality of this discharge information lies with you and/or your care-partner. 

## 2012-11-26 NOTE — Progress Notes (Signed)
Report to pacu rn, vss, bbs=clear 

## 2012-11-26 NOTE — Progress Notes (Signed)
Patient did not experience any of the following events: a burn prior to discharge; a fall within the facility; wrong site/side/patient/procedure/implant event; or a hospital transfer or hospital admission upon discharge from the facility. (G8907) Patient did not have preoperative order for IV antibiotic SSI prophylaxis. (G8918)  

## 2012-11-26 NOTE — Op Note (Signed)
[facility] [address] [place], [address]   ENDOSCOPY PROCEDURE REPORT  PATIENT: Tracy Strong, Tracy Strong  MR#: [id number] BIRTHDATE: Feb 07, 1961 , 52  yrs. old GENDER: Female ENDOSCOPIST: Meryl Dare, MD, [facility] PROCEDURE DATE:  11/26/2012 PROCEDURE:  EGD, diagnostic ASA CLASS:     Class II INDICATIONS:  Epigastric pain.   History of esophageal reflux. MEDICATIONS: MAC sedation, administered by CRNA and propofol (Diprivan) 150mg  IV TOPICAL ANESTHETIC: none DESCRIPTION OF PROCEDURE: After the risks benefits and alternatives of the procedure were thoroughly explained, informed consent was obtained.  The LB [id number] endoscope was introduced through the mouth and advanced to the second portion of the duodenum  without limitations.  The instrument was slowly withdrawn as the mucosa was fully examined.  ESOPHAGUS: The mucosa of the esophagus appeared normal. STOMACH: The mucosa and folds of the stomach appeared normal. DUODENUM: The duodenal mucosa showed no abnormalities in the bulb and second portion of the duodenum.  Retroflexed views revealed no abnormalities.  The scope was then withdrawn from the patient and the procedure completed.  COMPLICATIONS: There were no complications.  ENDOSCOPIC IMPRESSION: 1.   The EGD appeared normal  RECOMMENDATIONS: 1.  Anti-reflux regimen to be follow 2.  Continue PPI daily and hyoscyamine as nneeded   eSigned:  Meryl Dare, MD, [facility] 11/26/2012 8:22 AM   NG:EXBMW Caryl Never, MD

## 2012-11-27 ENCOUNTER — Telehealth: Payer: Self-pay

## 2012-11-27 NOTE — Telephone Encounter (Signed)
  Follow up Call-  Call back number 11/26/2012  Post procedure Call Back phone  # [phone]  Permission to leave phone message Yes     Patient questions:  Do you have a fever, pain , or abdominal swelling? no Pain Score  0 *  Have you tolerated food without any problems? yes  Have you been able to return to your normal activities? yes  Do you have any questions about your discharge instructions: Diet   no Medications  no Follow up visit  no  Do you have questions or concerns about your Care? no  Actions: * If pain score is 4 or above: No action needed, pain <4.  Per the pt, "I had a headache yesterday". " I took tylenol and the headache was gone this am". Maw

## 2013-02-03 ENCOUNTER — Other Ambulatory Visit

## 2013-02-04 ENCOUNTER — Encounter: Payer: Self-pay | Admitting: Family Medicine

## 2013-02-05 ENCOUNTER — Other Ambulatory Visit

## 2013-02-08 ENCOUNTER — Encounter: Payer: Self-pay | Admitting: Family Medicine

## 2013-02-09 ENCOUNTER — Other Ambulatory Visit (INDEPENDENT_AMBULATORY_CARE_PROVIDER_SITE_OTHER)

## 2013-02-09 ENCOUNTER — Ambulatory Visit: Admitting: Family Medicine

## 2013-02-09 DIAGNOSIS — E785 Hyperlipidemia, unspecified: Secondary | ICD-10-CM

## 2013-02-09 LAB — LIPID PANEL
LDL Cholesterol: 73 mg/dL (ref 0–99)
Total CHOL/HDL Ratio: 3

## 2013-02-09 LAB — HEPATIC FUNCTION PANEL
AST: 28 U/L (ref 0–37)
Alkaline Phosphatase: 64 U/L (ref 39–117)
Bilirubin, Direct: 0.1 mg/dL (ref 0.0–0.3)
Total Bilirubin: 0.8 mg/dL (ref 0.3–1.2)

## 2013-03-10 ENCOUNTER — Encounter: Payer: Self-pay | Admitting: Family Medicine

## 2013-05-28 ENCOUNTER — Telehealth: Payer: Self-pay | Admitting: Family Medicine

## 2013-05-28 NOTE — Telephone Encounter (Signed)
Pt needs generic ambien 5 mg take 1.5 pills at bedtime #135 for 90 day supply fax to [facility]  [phone] Please written pt SSN on rx [id number]. Please call new rx generic ambien 5 mg # 45 pills [facility].

## 2013-05-28 NOTE — Telephone Encounter (Signed)
Last visit 10/20/12 Last refill 10/20/12 #135  1 refill

## 2013-05-28 NOTE — Telephone Encounter (Signed)
Ok to refill 

## 2013-06-01 MED ORDER — ZOLPIDEM TARTRATE 5 MG PO TABS: ORAL_TABLET | ORAL | Status: DC

## 2013-06-01 NOTE — Telephone Encounter (Signed)
Rx printed pt aware that RX is ready for pick up. Rx is called into pharmacy.

## 2013-09-13 ENCOUNTER — Other Ambulatory Visit

## 2013-09-14 ENCOUNTER — Other Ambulatory Visit (INDEPENDENT_AMBULATORY_CARE_PROVIDER_SITE_OTHER)

## 2013-09-14 DIAGNOSIS — Z0000 Encounter for general adult medical examination without abnormal findings: Secondary | ICD-10-CM

## 2013-09-14 DIAGNOSIS — E785 Hyperlipidemia, unspecified: Secondary | ICD-10-CM

## 2013-09-14 LAB — HEPATIC FUNCTION PANEL
ALBUMIN: 3.7 g/dL (ref 3.5–5.2)
ALK PHOS: 65 U/L (ref 39–117)
ALT: 19 U/L (ref 0–35)
AST: 25 U/L (ref 0–37)
BILIRUBIN DIRECT: 0.1 mg/dL (ref 0.0–0.3)
TOTAL PROTEIN: 6.8 g/dL (ref 6.0–8.3)
Total Bilirubin: 0.7 mg/dL (ref 0.2–1.2)

## 2013-09-14 LAB — BASIC METABOLIC PANEL
BUN: 12 mg/dL (ref 6–23)
CALCIUM: 9.2 mg/dL (ref 8.4–10.5)
CO2: 27 mEq/L (ref 19–32)
Chloride: 106 mEq/L (ref 96–112)
Creatinine, Ser: 0.9 mg/dL (ref 0.4–1.2)
GFR: 71.1 mL/min (ref >60.00)
Glucose, Bld: 98 mg/dL (ref 70–99)
POTASSIUM: 4 meq/L (ref 3.5–5.1)
SODIUM: 141 meq/L (ref 135–145)

## 2013-09-14 LAB — CBC WITH DIFFERENTIAL/PLATELET
Basophils Absolute: 0 10*3/uL (ref 0.0–0.1)
Basophils Relative: 0.4 % (ref 0.0–3.0)
EOS PCT: 1.1 % (ref 0.0–5.0)
Eosinophils Absolute: 0.1 10*3/uL (ref 0.0–0.7)
HEMATOCRIT: 37.1 % (ref 36.0–46.0)
HEMOGLOBIN: 12.4 g/dL (ref 12.0–15.0)
LYMPHS ABS: 1.3 10*3/uL (ref 0.7–4.0)
LYMPHS PCT: 20 % (ref 12.0–46.0)
MCHC: 33.4 g/dL (ref 30.0–36.0)
MCV: 88.3 fl (ref 78.0–100.0)
MONOS PCT: 5.3 % (ref 3.0–12.0)
Monocytes Absolute: 0.3 10*3/uL (ref 0.1–1.0)
NEUTROS ABS: 4.8 10*3/uL (ref 1.4–7.7)
Neutrophils Relative %: 73.2 % (ref 43.0–77.0)
Platelets: 235 10*3/uL (ref 150.0–400.0)
RBC: 4.2 Mil/uL (ref 3.87–5.11)
RDW: 13.4 % (ref 11.5–15.5)
WBC: 6.5 10*3/uL (ref 4.0–10.5)

## 2013-09-14 LAB — LIPID PANEL
CHOLESTEROL: 173 mg/dL (ref 0–200)
HDL: 67.3 mg/dL (ref >39.00)
LDL CALC: 81 mg/dL (ref 0–99)
NONHDL: 105.7
Total CHOL/HDL Ratio: 3
Triglycerides: 123 mg/dL (ref 0.0–149.0)
VLDL: 24.6 mg/dL (ref 0.0–40.0)

## 2013-09-14 LAB — POCT URINALYSIS DIPSTICK
BILIRUBIN UA: NEGATIVE
Glucose, UA: NEGATIVE
KETONES UA: NEGATIVE
LEUKOCYTES UA: NEGATIVE
Nitrite, UA: NEGATIVE
PH UA: 6
PROTEIN UA: NEGATIVE
Spec Grav, UA: >=1.03
Urobilinogen, UA: 0.2

## 2013-09-14 LAB — TSH: TSH: 2.02 u[IU]/mL (ref 0.35–4.50)

## 2013-09-17 ENCOUNTER — Ambulatory Visit (INDEPENDENT_AMBULATORY_CARE_PROVIDER_SITE_OTHER): Admitting: Family Medicine

## 2013-09-17 ENCOUNTER — Encounter: Payer: Self-pay | Admitting: Family Medicine

## 2013-09-17 ENCOUNTER — Encounter: Admitting: Family Medicine

## 2013-09-17 ENCOUNTER — Other Ambulatory Visit: Payer: Self-pay

## 2013-09-17 VITALS — BP 132/80 | HR 84 | Temp 98.6°F | Ht 65.0 in | Wt 197.0 lb

## 2013-09-17 DIAGNOSIS — R319 Hematuria, unspecified: Secondary | ICD-10-CM

## 2013-09-17 DIAGNOSIS — Z0000 Encounter for general adult medical examination without abnormal findings: Secondary | ICD-10-CM

## 2013-09-17 DIAGNOSIS — F410 Panic disorder [episodic paroxysmal anxiety] without agoraphobia: Secondary | ICD-10-CM

## 2013-09-17 LAB — POCT URINALYSIS DIPSTICK
BILIRUBIN UA: NEGATIVE
GLUCOSE UA: NEGATIVE
Ketones, UA: NEGATIVE
Leukocytes, UA: NEGATIVE
Nitrite, UA: NEGATIVE
PH UA: 7
RBC UA: NEGATIVE
SPEC GRAV UA: 1.025
Urobilinogen, UA: 0.2

## 2013-09-17 MED ORDER — SERTRALINE HCL 50 MG PO TABS: 50.0000 mg | ORAL_TABLET | Freq: Every day | ORAL | Status: DC

## 2013-09-17 MED ORDER — ROSUVASTATIN CALCIUM 20 MG PO TABS: 20.0000 mg | ORAL_TABLET | Freq: Every day | ORAL | Status: DC

## 2013-09-17 MED ORDER — ESOMEPRAZOLE MAGNESIUM 40 MG PO CPDR: 40.0000 mg | DELAYED_RELEASE_CAPSULE | Freq: Every day | ORAL | Status: DC

## 2013-09-17 NOTE — Progress Notes (Signed)
Subjective:    Patient ID: Tracy Strong, female    DOB: Aug 10, 1960, 53 y.o.   MRN: [id number]  HPI Patient here for complete physical. She sees gynecologist regularly. Had recent Pap smear mammogram which reportedly normal. Colonoscopy 2011- diverticulosis otherwise normal. Nonsmoker. She's had steady weight gain in recent years. She would like some guidance regarding healthy way to lose weight. Currently no regular exercise.  Hyperlipidemia treated with Crestor. She has asthma which has been well controlled with Symbicort. She has GERD symptoms are controlled with Nexium  Past Medical History  Diagnosis Date  . HYPERLIPIDEMIA 12/02/2008  . DEPRESSION 12/02/2008  . GERD 12/02/2008  . INSOMNIA 12/02/2008  . Headache(784.0) 12/02/2008  . URINARY INCONTINENCE 12/02/2008  . PREDIABETES 12/02/2008  . Diverticulosis   . Asthma   . Arthritis   . Complication of anesthesia     took awhile waking up  . Contact lens/glasses fitting     wears glasses or contacts  . Anxiety    Past Surgical History  Procedure Laterality Date  . Abdominal hysterectomy  2000    partial  . Mohs surgery  2007    nose  . Shoulder arthroscopy  2010    right  . Colonoscopy    . Shoulder arthroscopy  01/07/2012    Procedure: ARTHROSCOPY SHOULDER;  Surgeon: Hessie Dibble, MD;  Location: [facility];  Service: Orthopedics;  Laterality: Left;  LEFT SHOULDER SCOPE with acromioplasty and distal clavicle resection    reports that she has never smoked. She has never used smokeless tobacco. She reports that she drinks alcohol. She reports that she does not use illicit drugs. family history includes Cancer in her other; Heart disease in her other; Hypertension in her other. Allergies  Allergen Reactions  . Latex     bruising  . Moxifloxacin     REACTION: AVALOX syncope episode, panic attacks      Review of Systems  Constitutional: Negative for fever, activity change, appetite change, fatigue and  unexpected weight change.  HENT: Negative for ear pain, hearing loss, sore throat and trouble swallowing.   Eyes: Negative for visual disturbance.  Respiratory: Negative for cough and shortness of breath.   Cardiovascular: Negative for chest pain and palpitations.  Gastrointestinal: Negative for abdominal pain, diarrhea, constipation and blood in stool.  Endocrine: Negative for polydipsia and polyuria.  Genitourinary: Negative for dysuria and hematuria.  Musculoskeletal: Negative for arthralgias, back pain and myalgias.  Skin: Negative for rash.  Neurological: Negative for dizziness, syncope and headaches.  Hematological: Negative for adenopathy.  Psychiatric/Behavioral: Negative for confusion and dysphoric mood.       Objective:   Physical Exam  Constitutional: She is oriented to person, place, and time. She appears well-developed and well-nourished.  HENT:  Head: Normocephalic and atraumatic.  Eyes: EOM are normal. Pupils are equal, round, and reactive to light.  Neck: Normal range of motion. Neck supple. No thyromegaly present.  Cardiovascular: Normal rate, regular rhythm and normal heart sounds.   No murmur heard. Pulmonary/Chest: Breath sounds normal. No respiratory distress. She has no wheezes. She has no rales.  Abdominal: Soft. Bowel sounds are normal. She exhibits no distension and no mass. There is no tenderness. There is no rebound and no guarding.  Genitourinary:  Per GYN  Musculoskeletal: Normal range of motion. She exhibits no edema.  Lymphadenopathy:    She has no cervical adenopathy.  Neurological: She is alert and oriented to person, place, and time. She displays normal  reflexes. No cranial nerve deficit.  Skin: No rash noted.  Psychiatric: She has a normal mood and affect. Her behavior is normal. Judgment and thought content normal.          Assessment & Plan:  Complete physical. We discussed strategies for weight loss. We will refer to local health coach  for further guidance regarding weight loss. Urine dipstick revealed trace blood otherwise labs all normal. Repeat urinalysis. Urine micro if shows blood.

## 2013-09-17 NOTE — Progress Notes (Signed)
Pre visit review using our clinic review tool, if applicable. No additional management support is needed unless otherwise documented below in the visit note. 

## 2013-09-17 NOTE — Patient Instructions (Signed)
Exercise to Lose Weight Exercise and a healthy diet may help you lose weight. Your doctor may suggest specific exercises. EXERCISE IDEAS AND TIPS  Choose low-cost things you enjoy doing, such as walking, bicycling, or exercising to workout videos.  Take stairs instead of the elevator.  Walk during your lunch break.  Park your car further away from work or school.  Go to a gym or an exercise class.  Start with 5 to 10 minutes of exercise each day. Build up to 30 minutes of exercise 4 to 6 days a week.  Wear shoes with good support and comfortable clothes.  Stretch before and after working out.  Work out until you breathe harder and your heart beats faster.  Drink extra water when you exercise.  Do not do so much that you hurt yourself, feel dizzy, or get very short of breath. Exercises that burn about 150 calories:  Running 1  miles in 15 minutes.  Playing volleyball for 45 to 60 minutes.  Washing and waxing a car for 45 to 60 minutes.  Playing touch football for 45 minutes.  Walking 1  miles in 35 minutes.  Pushing a stroller 1  miles in 30 minutes.  Playing basketball for 30 minutes.  Raking leaves for 30 minutes.  Bicycling 5 miles in 30 minutes.  Walking 2 miles in 30 minutes.  Dancing for 30 minutes.  Shoveling snow for 15 minutes.  Swimming laps for 20 minutes.  Walking up stairs for 15 minutes.  Bicycling 4 miles in 15 minutes.  Gardening for 30 to 45 minutes.  Jumping rope for 15 minutes.  Washing windows or floors for 45 to 60 minutes. Document Released: 04/13/2010 Document Revised: 06/03/2011 Document Reviewed: 04/13/2010 Exit[employer] Patient Information 2015 [employer]. This information is not intended to replace advice given to you by your health care provider. Make sure you discuss any questions you have with your health care provider.  

## 2013-09-22 ENCOUNTER — Encounter: Payer: Self-pay | Admitting: Family Medicine

## 2013-11-04 ENCOUNTER — Ambulatory Visit (INDEPENDENT_AMBULATORY_CARE_PROVIDER_SITE_OTHER): Admitting: Family Medicine

## 2013-11-04 ENCOUNTER — Encounter: Payer: Self-pay | Admitting: Family Medicine

## 2013-11-04 VITALS — BP 110/74 | HR 87 | Temp 98.3°F | Wt 196.0 lb

## 2013-11-04 DIAGNOSIS — G2581 Restless legs syndrome: Secondary | ICD-10-CM

## 2013-11-04 DIAGNOSIS — G4700 Insomnia, unspecified: Secondary | ICD-10-CM

## 2013-11-04 MED ORDER — PRAMIPEXOLE DIHYDROCHLORIDE 0.125 MG PO TABS: ORAL_TABLET | ORAL | Status: DC

## 2013-11-04 NOTE — Progress Notes (Signed)
   Subjective:    Patient ID: Tracy Strong, female    DOB: 02/19/1961, 53 y.o.   MRN: [id number]  HPI Patient's had some chronic insomnia issues. She been on low-dose Ambien for quite some time. However, she took herself off because of issues with getting up at night to eat and sometimes doing behaviors that she was not aware of the next day but were observed. She is currently taking Tylenol PM which helps somewhat.  Main issue though is she has frequent discomfort in her legs at night and sensation of restlessness. Sometimes relieved with walking or warm baths. Minimal caffeine use. No regular alcohol use. Recent labs revealed no anemia.  Past Medical History  Diagnosis Date  . HYPERLIPIDEMIA 12/02/2008  . DEPRESSION 12/02/2008  . GERD 12/02/2008  . INSOMNIA 12/02/2008  . Headache(784.0) 12/02/2008  . URINARY INCONTINENCE 12/02/2008  . PREDIABETES 12/02/2008  . Diverticulosis   . Asthma   . Arthritis   . Complication of anesthesia     took awhile waking up  . Contact lens/glasses fitting     wears glasses or contacts  . Anxiety    Past Surgical History  Procedure Laterality Date  . Abdominal hysterectomy  2000    partial  . Mohs surgery  2007    nose  . Shoulder arthroscopy  2010    right  . Colonoscopy    . Shoulder arthroscopy  01/07/2012    Procedure: ARTHROSCOPY SHOULDER;  Surgeon: Hessie Dibble, MD;  Location: [facility];  Service: Orthopedics;  Laterality: Left;  LEFT SHOULDER SCOPE with acromioplasty and distal clavicle resection    reports that she has never smoked. She has never used smokeless tobacco. She reports that she drinks alcohol. She reports that she does not use illicit drugs. family history includes Cancer in her other; Heart disease in her other; Hypertension in her other. Allergies  Allergen Reactions  . Latex     bruising  . Moxifloxacin     REACTION: AVALOX syncope episode, panic attacks      Review of Systems  Constitutional:  Negative for fatigue.  Eyes: Negative for visual disturbance.  Respiratory: Negative for cough, chest tightness, shortness of breath and wheezing.   Cardiovascular: Negative for chest pain, palpitations and leg swelling.  Neurological: Negative for dizziness, seizures, syncope, weakness, light-headedness and headaches.       Objective:   Physical Exam  Constitutional: She is oriented to person, place, and time. She appears well-developed and well-nourished.  Cardiovascular: Normal rate and regular rhythm.   Pulmonary/Chest: Effort normal and breath sounds normal. No respiratory distress. She has no wheezes. She has no rales.  Musculoskeletal: She exhibits no edema.  Neurological: She is alert and oriented to person, place, and time. No cranial nerve deficit.          Assessment & Plan:  #1 probable restless leg syndrome. We discussed possible triggering factors including caffeine, alcohol, anemia. No recent anemia. Minimal caffeine use. Trial of Mirapex 0.125 mg 1 each bedtime and titrate after a few nights to 0.25 mg as necessary. Touch base in 2-3 weeks if not seeing further improvement #2 chronic insomnia. Sleep hygiene discussed. Continue Tylenol PM for now and Mirapex as above.

## 2013-11-04 NOTE — Patient Instructions (Signed)
Restless Legs Syndrome Restless legs syndrome is a movement disorder. It may also be called a sensorimotor disorder.  CAUSES  No one knows what specifically causes restless legs syndrome, but it tends to run in families. It is also more common in people with low iron, in pregnancy, in people who need dialysis, and those with nerve damage (neuropathy).Some medications may make restless legs syndrome worse.Those medications include drugs to treat high blood pressure, some heart conditions, nausea, colds, allergies, and depression. SYMPTOMS Symptoms include uncomfortable sensations in the legs. These leg sensations are worse during periods of inactivity or rest. They are also worse while sitting or lying down. Individuals that have the disorder describe sensations in the legs that feel like:  Pulling.  Drawing.  Crawling.  Worming.  Boring.  Tingling.  Pins and needles.  Prickling.  Pain. The sensations are usually accompanied by an overwhelming urge to move the legs. Sudden muscle jerks may also occur. Movement provides temporary relief from the discomfort. In rare cases, the arms may also be affected. Symptoms may interfere with going to sleep (sleep onset insomnia). Restless legs syndrome may also be related to periodic limb movement disorder (PLMD). PLMD is another more common motor disorder. It also causes interrupted sleep. The symptoms from PLMD usually occur most often when you are awake. TREATMENT  Treatment for restless legs syndrome is symptomatic. This means that the symptoms are treated.   Massage and cold compresses may provide temporary relief.  Walk, stretch, or take a cold or hot bath.  Get regular exercise and a good night's sleep.  Avoid caffeine, alcohol, nicotine, and medications that can make it worse.  Do activities that provide mental stimulation like discussions, needlework, and video games. These may be helpful if you are not able to walk or stretch. Some  medications are effective in relieving the symptoms. However, many of these medications have side effects. Ask your caregiver about medications that may help your symptoms. Correcting iron deficiency may improve symptoms for some patients. Document Released: 03/01/2002 Document Revised: 07/26/2013 Document Reviewed: 06/07/2010 ExitCare Patient Information 2015 [employer]. This information is not intended to replace advice given to you by your health care provider. Make sure you discuss any questions you have with your health care provider.  

## 2013-11-04 NOTE — Progress Notes (Signed)
Pre visit review using our clinic review tool, if applicable. No additional management support is needed unless otherwise documented below in the visit note. 

## 2014-01-05 ENCOUNTER — Emergency Department (HOSPITAL_BASED_OUTPATIENT_CLINIC_OR_DEPARTMENT_OTHER)

## 2014-01-05 ENCOUNTER — Emergency Department (HOSPITAL_BASED_OUTPATIENT_CLINIC_OR_DEPARTMENT_OTHER)
Admission: EM | Admit: 2014-01-05 | Discharge: 2014-01-05 | Disposition: A | Discharge status: Home / Self Care | Attending: Emergency Medicine | Admitting: Emergency Medicine

## 2014-01-05 ENCOUNTER — Encounter (HOSPITAL_BASED_OUTPATIENT_CLINIC_OR_DEPARTMENT_OTHER): Payer: Self-pay | Admitting: Emergency Medicine

## 2014-01-05 DIAGNOSIS — Z791 Long term (current) use of non-steroidal anti-inflammatories (NSAID): Secondary | ICD-10-CM | POA: Insufficient documentation

## 2014-01-05 DIAGNOSIS — G4700 Insomnia, unspecified: Secondary | ICD-10-CM | POA: Insufficient documentation

## 2014-01-05 DIAGNOSIS — M1990 Unspecified osteoarthritis, unspecified site: Secondary | ICD-10-CM | POA: Diagnosis not present

## 2014-01-05 DIAGNOSIS — Z91040 Latex allergy status: Secondary | ICD-10-CM | POA: Insufficient documentation

## 2014-01-05 DIAGNOSIS — E785 Hyperlipidemia, unspecified: Secondary | ICD-10-CM | POA: Diagnosis not present

## 2014-01-05 DIAGNOSIS — J45909 Unspecified asthma, uncomplicated: Secondary | ICD-10-CM | POA: Insufficient documentation

## 2014-01-05 DIAGNOSIS — Z79899 Other long term (current) drug therapy: Secondary | ICD-10-CM | POA: Diagnosis not present

## 2014-01-05 DIAGNOSIS — K219 Gastro-esophageal reflux disease without esophagitis: Secondary | ICD-10-CM | POA: Diagnosis not present

## 2014-01-05 DIAGNOSIS — F419 Anxiety disorder, unspecified: Secondary | ICD-10-CM | POA: Insufficient documentation

## 2014-01-05 DIAGNOSIS — M79605 Pain in left leg: Secondary | ICD-10-CM | POA: Insufficient documentation

## 2014-01-05 DIAGNOSIS — F329 Major depressive disorder, single episode, unspecified: Secondary | ICD-10-CM | POA: Insufficient documentation

## 2014-01-05 LAB — BASIC METABOLIC PANEL
ANION GAP: 14 (ref 5–15)
BUN: 16 mg/dL (ref 6–23)
CALCIUM: 9.4 mg/dL (ref 8.4–10.5)
CHLORIDE: 103 meq/L (ref 96–112)
CO2: 26 meq/L (ref 19–32)
Creatinine, Ser: 0.8 mg/dL (ref 0.50–1.10)
GFR calc Af Amer: >90 mL/min (ref >90)
GFR calc non Af Amer: 82 mL/min — ABNORMAL LOW (ref >90)
Glucose, Bld: 108 mg/dL — ABNORMAL HIGH (ref 70–99)
Potassium: 3.9 mEq/L (ref 3.7–5.3)
SODIUM: 143 meq/L (ref 137–147)

## 2014-01-05 LAB — CBC WITH DIFFERENTIAL/PLATELET
BASOS PCT: 0 % (ref 0–1)
Basophils Absolute: 0 10*3/uL (ref 0.0–0.1)
Eosinophils Absolute: 0.1 10*3/uL (ref 0.0–0.7)
Eosinophils Relative: 1 % (ref 0–5)
HCT: 37.4 % (ref 36.0–46.0)
Hemoglobin: 12.2 g/dL (ref 12.0–15.0)
LYMPHS PCT: 21 % (ref 12–46)
Lymphs Abs: 1.6 10*3/uL (ref 0.7–4.0)
MCH: 29.7 pg (ref 26.0–34.0)
MCHC: 32.6 g/dL (ref 30.0–36.0)
MCV: 91 fL (ref 78.0–100.0)
Monocytes Absolute: 0.5 10*3/uL (ref 0.1–1.0)
Monocytes Relative: 7 % (ref 3–12)
Neutro Abs: 5.6 10*3/uL (ref 1.7–7.7)
Neutrophils Relative %: 71 % (ref 43–77)
PLATELETS: 231 10*3/uL (ref 150–400)
RBC: 4.11 MIL/uL (ref 3.87–5.11)
RDW: 13.2 % (ref 11.5–15.5)
WBC: 7.9 10*3/uL (ref 4.0–10.5)

## 2014-01-05 LAB — PROTIME-INR
INR: 0.93 (ref 0.00–1.49)
PROTHROMBIN TIME: 12.5 s (ref 11.6–15.2)

## 2014-01-05 LAB — D-DIMER, QUANTITATIVE (NOT AT ARMC)

## 2014-01-05 MED ORDER — CYCLOBENZAPRINE HCL 10 MG PO TABS: 10.0000 mg | ORAL_TABLET | Freq: Two times a day (BID) | ORAL | Status: DC | PRN

## 2014-01-05 NOTE — ED Notes (Signed)
MD at bedside. 

## 2014-01-05 NOTE — ED Notes (Signed)
Pain in left leg. Recent travel to [place]. Pain in upper leg, radiating to leg. Tenderness with palpation to left leg

## 2014-01-05 NOTE — Discharge Instructions (Signed)
You have no evidence of blood clot in your leg or lungs.  Please continue nsaid medication and you may use flexeril ( a skeletal muscle relaxant) fur additional pain control.

## 2014-01-05 NOTE — ED Provider Notes (Signed)
CSN: [id number]     Arrival date & time 01/05/14  1308 History   First MD Initiated Contact with Patient 01/05/14 0730     Chief Complaint  Patient presents with  . Leg Pain     (Consider location/radiation/quality/duration/timing/severity/associated sxs/prior Treatment) HPI 53 y.o. Female complaining of left thigh pain with lateral bruising noted 3 weeks ago.  Pain worsening with radiation to entire thigh.  States symptoms began after long car trip.  She has noted some sensation of dyspnea in past 24 hours with most noted with deep inspiration.  She denies other chest pain, cough, fever, shoulder, neck, back pain, hemoptysis, nausea, vomiting.  Denies uti symptoms, back pain, but endorses some buttock  Pain and pain increased with movement.  Patient also states recent problems with restless leg syndrome.   Past Medical History  Diagnosis Date  . HYPERLIPIDEMIA 12/02/2008  . DEPRESSION 12/02/2008  . GERD 12/02/2008  . INSOMNIA 12/02/2008  . Headache(784.0) 12/02/2008  . URINARY INCONTINENCE 12/02/2008  . PREDIABETES 12/02/2008  . Diverticulosis   . Asthma   . Arthritis   . Complication of anesthesia     took awhile waking up  . Contact lens/glasses fitting     wears glasses or contacts  . Anxiety    Past Surgical History  Procedure Laterality Date  . Abdominal hysterectomy  2000    partial  . Mohs surgery  2007    nose  . Shoulder arthroscopy  2010    right  . Colonoscopy    . Shoulder arthroscopy  01/07/2012    Procedure: ARTHROSCOPY SHOULDER;  Surgeon: Hessie Dibble, MD;  Location: [facility];  Service: Orthopedics;  Laterality: Left;  LEFT SHOULDER SCOPE with acromioplasty and distal clavicle resection   Family History  Problem Relation Age of Onset  . Hypertension Other   . Cancer Other     lung, prostate  . Heart disease Other    History  Substance Use Topics  . Smoking status: Never Smoker   . Smokeless tobacco: Never Used  . Alcohol Use: Yes     Comment: occ   OB History   Grav Para Term Preterm Abortions TAB SAB Ect Mult Living                 Review of Systems  All other systems reviewed and are negative.     Allergies  Latex and Moxifloxacin  Home Medications   Prior to Admission medications   Medication Sig Start Date End Date Taking? Authorizing Provider  albuterol (PROVENTIL HFA;VENTOLIN HFA) 108 (90 BASE) MCG/ACT inhaler Inhale 2 puffs into the lungs every 4 (four) hours as needed for wheezing. 03/06/11 09/17/13  Eulas Post, MD  budesonide-formoterol (SYMBICORT) 160-4.5 MCG/ACT inhaler Inhale 2 puffs into the lungs 2 (two) times daily as needed.    Historical Provider, MD  calcium citrate-vitamin D (CITRACAL+D) 315-200 MG-UNIT per tablet Take 1 tablet by mouth 2 (two) times daily.      Historical Provider, MD  esomeprazole (NEXIUM) 40 MG capsule Take 1 capsule (40 mg total) by mouth daily before breakfast. 09/17/13   Eulas Post, MD  estrogens, conjugated, (PREMARIN) 0.9 MG tablet Take 0.9 mg by mouth daily.    Historical Provider, MD  hyoscyamine (LEVSIN SL) 0.125 MG SL tablet 1-2 tablets by mouth every 4 hours as needed for abdominal pain and bloating 11/19/12   Ladene Artist, MD  meloxicam (MOBIC) 15 MG tablet Take 15 mg by mouth daily.  Historical Provider, MD  montelukast (SINGULAIR) 10 MG tablet Take 1 tablet (10 mg total) by mouth at bedtime. 05/31/10   Eulas Post, MD  pramipexole (MIRAPEX) 0.125 MG tablet Take one nightly for restless leg symptoms and may titrate to two at night if necessary. 11/04/13   Eulas Post, MD  rosuvastatin (CRESTOR) 20 MG tablet Take 1 tablet (20 mg total) by mouth daily. 09/17/13 09/17/14  Eulas Post, MD  sertraline (ZOLOFT) 50 MG tablet Take 1 tablet (50 mg total) by mouth daily. 09/17/13 07/07/15  Eulas Post, MD  zolpidem (AMBIEN) 5 MG tablet Take one and one half tablet po qhs 06/01/13   Eulas Post, MD   BP 125/68  Pulse 86  Temp(Src)  97.5 F (36.4 C) (Oral)  Resp 18  Ht 5\' 5"  (1.651 m)  Wt 185 lb (83.915 kg)  BMI 30.79 kg/m2  SpO2 97% Physical Exam  Nursing note and vitals reviewed. Constitutional: She is oriented to person, place, and time. She appears well-developed and well-nourished.  HENT:  Head: Normocephalic and atraumatic.  Right Ear: External ear normal.  Left Ear: External ear normal.  Nose: Nose normal.  Mouth/Throat: Oropharynx is clear and moist.  Eyes: Conjunctivae and EOM are normal. Pupils are equal, round, and reactive to light.  Neck: Normal range of motion. Neck supple.  Cardiovascular: Normal rate, regular rhythm, normal heart sounds and intact distal pulses.   Pulmonary/Chest: Effort normal and breath sounds normal.  Abdominal: Soft. Bowel sounds are normal.  Musculoskeletal: Normal range of motion. She exhibits tenderness. She exhibits no edema.       Legs: Neurological: She is alert and oriented to person, place, and time. She has normal reflexes.  Skin: Skin is warm and dry.  Psychiatric: She has a normal mood and affect. Her behavior is normal. Judgment and thought content normal.    ED Course  Procedures (including critical care time) Labs Review Labs Reviewed  BASIC METABOLIC PANEL - Abnormal; Notable for the following:    Glucose, Bld 108 (*)    GFR calc non Af Amer 82 (*)    All other components within normal limits  CBC WITH DIFFERENTIAL  D-DIMER, QUANTITATIVE  PROTIME-INR    Imaging Review Dg Chest 2 View  01/05/2014   CLINICAL DATA:  53 year old female with acute anterior chest pain and shortness of Breath. Associated lower extremity pain. Initial encounter.  EXAM: CHEST  2 VIEW  COMPARISON:  Chest radiographs 03/13/2011.  FINDINGS: Lower lung volumes. Stable eventration of the right hemidiaphragm. Normal cardiac size and mediastinal contours. Visualized tracheal air column is within normal limits. No pneumothorax, pulmonary edema, pleural effusion or confluent pulmonary  opacity. No osseous abnormality identified.  IMPRESSION: Lower lung volumes, otherwise negative. No acute cardiopulmonary abnormality.   Electronically Signed   By: Lars Pinks M.D.   On: 01/05/2014 08:03     EKG Interpretation   Date/Time:  Wednesday January 05 2014 08:27:31 EDT Ventricular Rate:  70 PR Interval:  146 QRS Duration: 88 QT Interval:  408 QTC Calculation: 440 R Axis:   19 Text Interpretation:  Normal sinus rhythm Normal ECG Confirmed by Zerick Prevette MD,  Dusten Ellinwood ([id number]) on 01/05/2014 8:30:16 AM      MDM   Final diagnoses:  Lower extremity pain, left    Doppler negative, d-dimer negative, cxr and ekg normal.  I suspect leg pain is from some radiculopathy.  With negative [place] and d-dimer, extremely low risk of dvt.  Patient's chest  pain sound pleuritic and with no exertional symptoms, pain only with deep breath, clear chest x-[place], and negative d-dimer, doubt further work up would be beneficial at this time.       Shaune Pollack, MD 01/05/14 [phone]

## 2014-01-19 ENCOUNTER — Encounter: Payer: Self-pay | Admitting: Family Medicine

## 2014-07-08 ENCOUNTER — Telehealth: Payer: Self-pay | Admitting: Family Medicine

## 2014-07-08 DIAGNOSIS — F410 Panic disorder [episodic paroxysmal anxiety] without agoraphobia: Secondary | ICD-10-CM

## 2014-07-08 MED ORDER — SERTRALINE HCL 50 MG PO TABS: 50.0000 mg | ORAL_TABLET | Freq: Every day | ORAL | Status: DC

## 2014-07-08 MED ORDER — ESOMEPRAZOLE MAGNESIUM 40 MG PO CPDR: 40.0000 mg | DELAYED_RELEASE_CAPSULE | Freq: Every day | ORAL | Status: DC

## 2014-07-08 MED ORDER — ROSUVASTATIN CALCIUM 20 MG PO TABS: 20.0000 mg | ORAL_TABLET | Freq: Every day | ORAL | Status: DC

## 2014-07-08 NOTE — Telephone Encounter (Signed)
Pt request refill  rosuvastatin (CRESTOR) 20 MG tablet sertraline (ZOLOFT) 50 MG tablet esomeprazole (NEXIUM) 40 MG capsule  Pt had made cpe for 7/6 but will needs  90 day supply sent to get her through

## 2014-07-08 NOTE — Telephone Encounter (Signed)
Rx sent to pharmacy   

## 2014-08-16 ENCOUNTER — Encounter: Payer: Self-pay | Admitting: *Deleted

## 2014-09-21 ENCOUNTER — Other Ambulatory Visit (INDEPENDENT_AMBULATORY_CARE_PROVIDER_SITE_OTHER)

## 2014-09-21 ENCOUNTER — Telehealth: Payer: Self-pay | Admitting: Family Medicine

## 2014-09-21 DIAGNOSIS — E785 Hyperlipidemia, unspecified: Secondary | ICD-10-CM

## 2014-09-21 DIAGNOSIS — F410 Panic disorder [episodic paroxysmal anxiety] without agoraphobia: Secondary | ICD-10-CM

## 2014-09-21 DIAGNOSIS — Z0000 Encounter for general adult medical examination without abnormal findings: Secondary | ICD-10-CM

## 2014-09-21 LAB — LIPID PANEL
Cholesterol: 176 mg/dL (ref 0–200)
HDL: 60.3 mg/dL (ref >39.00)
LDL Cholesterol: 87 mg/dL (ref 0–99)
NONHDL: 115.7
Total CHOL/HDL Ratio: 3
Triglycerides: 143 mg/dL (ref 0.0–149.0)
VLDL: 28.6 mg/dL (ref 0.0–40.0)

## 2014-09-21 LAB — COMPREHENSIVE METABOLIC PANEL
ALBUMIN: 4.1 g/dL (ref 3.5–5.2)
ALT: 21 U/L (ref 0–35)
AST: 28 U/L (ref 0–37)
Alkaline Phosphatase: 62 U/L (ref 39–117)
BUN: 14 mg/dL (ref 6–23)
CALCIUM: 9.5 mg/dL (ref 8.4–10.5)
CO2: 31 meq/L (ref 19–32)
Chloride: 104 mEq/L (ref 96–112)
Creatinine, Ser: 0.85 mg/dL (ref 0.40–1.20)
GFR: 73.73 mL/min (ref >60.00)
Glucose, Bld: 99 mg/dL (ref 70–99)
POTASSIUM: 4.2 meq/L (ref 3.5–5.1)
Sodium: 142 mEq/L (ref 135–145)
Total Bilirubin: 0.4 mg/dL (ref 0.2–1.2)
Total Protein: 6.9 g/dL (ref 6.0–8.3)

## 2014-09-21 LAB — CBC WITH DIFFERENTIAL/PLATELET
BASOS PCT: 0.5 % (ref 0.0–3.0)
Basophils Absolute: 0 10*3/uL (ref 0.0–0.1)
EOS PCT: 0.9 % (ref 0.0–5.0)
Eosinophils Absolute: 0.1 10*3/uL (ref 0.0–0.7)
HCT: 38.2 % (ref 36.0–46.0)
HEMOGLOBIN: 12.6 g/dL (ref 12.0–15.0)
Lymphocytes Relative: 23.5 % (ref 12.0–46.0)
Lymphs Abs: 1.8 10*3/uL (ref 0.7–4.0)
MCHC: 32.8 g/dL (ref 30.0–36.0)
MCV: 88.1 fl (ref 78.0–100.0)
MONO ABS: 0.3 10*3/uL (ref 0.1–1.0)
MONOS PCT: 4.5 % (ref 3.0–12.0)
NEUTROS PCT: 70.6 % (ref 43.0–77.0)
Neutro Abs: 5.4 10*3/uL (ref 1.4–7.7)
PLATELETS: 228 10*3/uL (ref 150.0–400.0)
RBC: 4.34 Mil/uL (ref 3.87–5.11)
RDW: 13.9 % (ref 11.5–15.5)
WBC: 7.7 10*3/uL (ref 4.0–10.5)

## 2014-09-21 LAB — TSH: TSH: 3.24 u[IU]/mL (ref 0.35–4.50)

## 2014-09-21 MED ORDER — SERTRALINE HCL 50 MG PO TABS: 50.0000 mg | ORAL_TABLET | Freq: Every day | ORAL | Status: DC

## 2014-09-21 NOTE — Telephone Encounter (Signed)
Pt request refill of the following: sertraline (ZOLOFT) 50 MG tablet    She said she usually do mail order but she is out of the med   Phamacy: [facility]

## 2014-09-21 NOTE — Telephone Encounter (Signed)
Rx sent to pharmacy   

## 2014-09-28 ENCOUNTER — Encounter: Payer: Self-pay | Admitting: Family Medicine

## 2014-09-28 ENCOUNTER — Ambulatory Visit (INDEPENDENT_AMBULATORY_CARE_PROVIDER_SITE_OTHER): Admitting: Family Medicine

## 2014-09-28 VITALS — BP 120/80 | HR 80 | Temp 98.5°F | Ht 65.0 in | Wt 193.0 lb

## 2014-09-28 DIAGNOSIS — F410 Panic disorder [episodic paroxysmal anxiety] without agoraphobia: Secondary | ICD-10-CM | POA: Diagnosis not present

## 2014-09-28 DIAGNOSIS — Z0000 Encounter for general adult medical examination without abnormal findings: Secondary | ICD-10-CM

## 2014-09-28 MED ORDER — PRAMIPEXOLE DIHYDROCHLORIDE 0.125 MG PO TABS: ORAL_TABLET | ORAL | Status: DC

## 2014-09-28 MED ORDER — ROSUVASTATIN CALCIUM 20 MG PO TABS: 20.0000 mg | ORAL_TABLET | Freq: Every day | ORAL | Status: DC

## 2014-09-28 MED ORDER — SERTRALINE HCL 50 MG PO TABS: 50.0000 mg | ORAL_TABLET | Freq: Every day | ORAL | Status: DC

## 2014-09-28 MED ORDER — ESOMEPRAZOLE MAGNESIUM 40 MG PO CPDR: 40.0000 mg | DELAYED_RELEASE_CAPSULE | Freq: Every day | ORAL | Status: DC

## 2014-09-28 NOTE — Progress Notes (Signed)
Subjective:    Patient ID: Tracy Strong, female    DOB: 04/16/1960, 54 y.o.   MRN: [id number]  HPI Patient seen for complete physical. She sees gynecologist yearly. Mammogram last year reportedly normal. Never smoked. She has restless leg syndrome which has been controlled with low-dose Mirapex. History of prediabetes. No consistent exercise. 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 Hyperlipidemia treated with Crestor. She has long history of allergies and reactive airway disease and is followed by allergist. Colonoscopy up-to-date. Immunizations up-to-date.  Past Medical History  Diagnosis Date  . HYPERLIPIDEMIA 12/02/2008  . DEPRESSION 12/02/2008  . GERD 12/02/2008  . INSOMNIA 12/02/2008  . Headache(784.0) 12/02/2008  . URINARY INCONTINENCE 12/02/2008  . PREDIABETES 12/02/2008  . Diverticulosis   . Asthma   . Arthritis   . Complication of anesthesia     took awhile waking up  . Contact lens/glasses fitting     wears glasses or contacts  . Anxiety    Past Surgical History  Procedure Laterality Date  . Abdominal hysterectomy  2000    partial  . Mohs surgery  2007    nose  . Shoulder arthroscopy  2010    right  . Colonoscopy    . Shoulder arthroscopy  01/07/2012    Procedure: ARTHROSCOPY SHOULDER;  Surgeon: Hessie Dibble, MD;  Location: [facility];  Service: Orthopedics;  Laterality: Left;  LEFT SHOULDER SCOPE with acromioplasty and distal clavicle resection    reports that she has never smoked. She has never used smokeless tobacco. She reports that she drinks alcohol. She reports that she does not use illicit drugs. family history includes Cancer in her other; Heart disease in her other; Hypertension in her other. Allergies  Allergen Reactions  . Latex     bruising  . Moxifloxacin     REACTION: AVALOX syncope episode, panic attacks      Review of Systems  Constitutional: Negative for fever, activity change, appetite change, fatigue  and unexpected weight change.  HENT: Negative for ear pain, hearing loss, sore throat and trouble swallowing.   Eyes: Negative for visual disturbance.  Respiratory: Negative for cough and shortness of breath.   Cardiovascular: Negative for chest pain and palpitations.  Gastrointestinal: Negative for abdominal pain, diarrhea, constipation and blood in stool.  Genitourinary: Negative for dysuria and hematuria.  Musculoskeletal: Negative for myalgias, back pain and arthralgias.  Skin: Negative for rash.  Neurological: Negative for dizziness, syncope and headaches.  Hematological: Negative for adenopathy.  Psychiatric/Behavioral: Negative for confusion and dysphoric mood.       Objective:   Physical Exam  Constitutional: She is oriented to person, place, and time. She appears well-developed and well-nourished.  HENT:  Head: Normocephalic and atraumatic.  Eyes: EOM are normal. Pupils are equal, round, and reactive to light.  Neck: Normal range of motion. Neck supple. No thyromegaly present.  Cardiovascular: Normal rate, regular rhythm and normal heart sounds.   No murmur heard. Pulmonary/Chest: Breath sounds normal. No respiratory distress. She has no wheezes. She has no rales.  Abdominal: Soft. Bowel sounds are normal. She exhibits no distension and no mass. There is no tenderness. There is no rebound and no guarding.  Genitourinary:  Per GYN  Musculoskeletal: Normal range of motion. She exhibits no edema.  Lymphadenopathy:    She has no cervical adenopathy.  Neurological: She is alert and oriented to person, place, and time. She displays normal reflexes. No cranial nerve deficit.  Skin: No rash noted.  Psychiatric: She has a normal mood and affect. Her behavior is normal. Judgment and thought content normal.          Assessment & Plan:  Complete physical. Labs reviewed. Blood sugar 99. Borderline for prediabetes. We strongly advocated weight loss and establish more consistent  exercise. She will continue regular GYN follow-up

## 2014-09-28 NOTE — Patient Instructions (Signed)
Insomnia Insomnia is frequent trouble falling and/or staying asleep. Insomnia can be a long term problem or a short term problem. Both are common. Insomnia can be a short term problem when the wakefulness is related to a certain stress or worry. Long term insomnia is often related to ongoing stress during waking hours and/or poor sleeping habits. Overtime, sleep deprivation itself can make the problem worse. Every little thing feels more severe because you are overtired and your ability to cope is decreased. CAUSES   Stress, anxiety, and depression.  Poor sleeping habits.  Distractions such as TV in the bedroom.  Naps close to bedtime.  Engaging in emotionally charged conversations before bed.  Technical reading before sleep.  Alcohol and other sedatives. They may make the problem worse. They can hurt normal sleep patterns and normal dream activity.  Stimulants such as caffeine for several hours prior to bedtime.  Pain syndromes and shortness of breath can cause insomnia.  Exercise late at night.  Changing time zones may cause sleeping problems (jet lag). It is sometimes helpful to have someone observe your sleeping patterns. They should look for periods of not breathing during the night (sleep apnea). They should also look to see how long those periods last. If you live alone or observers are uncertain, you can also be observed at a sleep clinic where your sleep patterns will be professionally monitored. Sleep apnea requires a checkup and treatment. Give your caregivers your medical history. Give your caregivers observations your family has made about your sleep.  SYMPTOMS   Not feeling rested in the morning.  Anxiety and restlessness at bedtime.  Difficulty falling and staying asleep. TREATMENT   Your caregiver may prescribe treatment for an underlying medical disorders. Your caregiver can give advice or help if you are using alcohol or other drugs for self-medication. Treatment  of underlying problems will usually eliminate insomnia problems.  Medications can be prescribed for short time use. They are generally not recommended for lengthy use.  Over-the-counter sleep medicines are not recommended for lengthy use. They can be habit forming.  You can promote easier sleeping by making lifestyle changes such as:  Using relaxation techniques that help with breathing and reduce muscle tension.  Exercising earlier in the day.  Changing your diet and the time of your last meal. No night time snacks.  Establish a regular time to go to bed.  Counseling can help with stressful problems and worry.  Soothing music and white noise may be helpful if there are background noises you cannot remove.  Stop tedious detailed work at least one hour before bedtime. HOME CARE INSTRUCTIONS   Keep a diary. Inform your caregiver about your progress. This includes any medication side effects. See your caregiver regularly. Take note of:  Times when you are asleep.  Times when you are awake during the night.  The quality of your sleep.  How you feel the next day. This information will help your caregiver care for you.  Get out of bed if you are still awake after 15 minutes. Read or do some quiet activity. Keep the lights down. Wait until you feel sleepy and go back to bed.  Keep regular sleeping and waking hours. Avoid naps.  Exercise regularly.  Avoid distractions at bedtime. Distractions include watching television or engaging in any intense or detailed activity like attempting to balance the household checkbook.  Develop a bedtime ritual. Keep a familiar routine of bathing, brushing your teeth, climbing into bed at the same   time each night, listening to soothing music. Routines increase the success of falling to sleep faster.  Use relaxation techniques. This can be using breathing and muscle tension release routines. It can also include visualizing peaceful scenes. You can  also help control troubling or intruding thoughts by keeping your mind occupied with boring or repetitive thoughts like the old concept of counting sheep. You can make it more creative like imagining planting one beautiful flower after another in your backyard garden.  During your day, work to eliminate stress. When this is not possible use some of the previous suggestions to help reduce the anxiety that accompanies stressful situations. MAKE SURE YOU:   Understand these instructions.  Will watch your condition.  Will get help right away if you are not doing well or get worse. Document Released: 03/08/2000 Document Revised: 06/03/2011 Document Reviewed: 04/08/2007 ExitCare Patient Information 2015 [employer]. This information is not intended to replace advice given to you by your health care provider. Make sure you discuss any questions you have with your health care provider.  

## 2014-09-28 NOTE — Progress Notes (Signed)
Pre visit review using our clinic review tool, if applicable. No additional management support is needed unless otherwise documented below in the visit note. 

## 2014-11-15 ENCOUNTER — Encounter: Payer: Self-pay | Admitting: Family Medicine

## 2015-01-18 ENCOUNTER — Other Ambulatory Visit: Payer: Self-pay | Admitting: Family Medicine

## 2015-01-18 NOTE — Telephone Encounter (Signed)
This medication is not controlled but if she received #90 with 2 refills in July this should take her through until next March.

## 2015-01-18 NOTE — Telephone Encounter (Signed)
Pt last visit 09/28/14 and last Rx refill 09/28/14 #90 with 2 refill

## 2015-05-23 ENCOUNTER — Other Ambulatory Visit: Payer: Self-pay | Admitting: Allergy and Immunology

## 2015-05-23 ENCOUNTER — Ambulatory Visit
Admission: RE | Admit: 2015-05-23 | Discharge: 2015-05-23 | Disposition: A | Discharge status: Home / Self Care | Source: Ambulatory Visit | Attending: Allergy and Immunology | Admitting: Allergy and Immunology

## 2015-05-23 DIAGNOSIS — R05 Cough: Secondary | ICD-10-CM

## 2015-05-23 DIAGNOSIS — R059 Cough, unspecified: Secondary | ICD-10-CM

## 2015-05-24 ENCOUNTER — Encounter: Payer: Self-pay | Admitting: Family Medicine

## 2015-05-24 NOTE — Telephone Encounter (Signed)
Please advise 

## 2015-06-07 ENCOUNTER — Other Ambulatory Visit: Payer: Self-pay | Admitting: Family Medicine

## 2015-06-08 ENCOUNTER — Other Ambulatory Visit: Payer: Self-pay | Admitting: Family Medicine

## 2015-06-08 MED ORDER — ALBUTEROL SULFATE HFA 108 (90 BASE) MCG/ACT IN AERS: 2.0000 | INHALATION_SPRAY | RESPIRATORY_TRACT | Status: DC | PRN

## 2015-08-01 ENCOUNTER — Other Ambulatory Visit: Payer: Self-pay | Admitting: Family Medicine

## 2015-08-08 ENCOUNTER — Other Ambulatory Visit (INDEPENDENT_AMBULATORY_CARE_PROVIDER_SITE_OTHER)

## 2015-08-08 DIAGNOSIS — Z0000 Encounter for general adult medical examination without abnormal findings: Secondary | ICD-10-CM | POA: Diagnosis not present

## 2015-08-08 LAB — LIPID PANEL
CHOL/HDL RATIO: 3
Cholesterol: 168 mg/dL (ref 0–200)
HDL: 51.2 mg/dL (ref >39.00)
LDL Cholesterol: 95 mg/dL (ref 0–99)
NonHDL: 117.2
Triglycerides: 113 mg/dL (ref 0.0–149.0)
VLDL: 22.6 mg/dL (ref 0.0–40.0)

## 2015-08-08 LAB — CBC WITH DIFFERENTIAL/PLATELET
Basophils Absolute: 0 10*3/uL (ref 0.0–0.1)
Basophils Relative: 0.5 % (ref 0.0–3.0)
EOS ABS: 0.1 10*3/uL (ref 0.0–0.7)
Eosinophils Relative: 1.4 % (ref 0.0–5.0)
HEMATOCRIT: 36.4 % (ref 36.0–46.0)
HEMOGLOBIN: 12.2 g/dL (ref 12.0–15.0)
LYMPHS PCT: 25 % (ref 12.0–46.0)
Lymphs Abs: 1.6 10*3/uL (ref 0.7–4.0)
MCHC: 33.4 g/dL (ref 30.0–36.0)
MCV: 87.1 fl (ref 78.0–100.0)
MONOS PCT: 5.6 % (ref 3.0–12.0)
Monocytes Absolute: 0.4 10*3/uL (ref 0.1–1.0)
NEUTROS ABS: 4.4 10*3/uL (ref 1.4–7.7)
Neutrophils Relative %: 67.5 % (ref 43.0–77.0)
PLATELETS: 218 10*3/uL (ref 150.0–400.0)
RBC: 4.18 Mil/uL (ref 3.87–5.11)
RDW: 14.1 % (ref 11.5–15.5)
WBC: 6.5 10*3/uL (ref 4.0–10.5)

## 2015-08-08 LAB — BASIC METABOLIC PANEL
BUN: 14 mg/dL (ref 6–23)
CO2: 27 meq/L (ref 19–32)
CREATININE: 0.77 mg/dL (ref 0.40–1.20)
Calcium: 9 mg/dL (ref 8.4–10.5)
Chloride: 107 mEq/L (ref 96–112)
GFR: 82.37 mL/min (ref >60.00)
Glucose, Bld: 105 mg/dL — ABNORMAL HIGH (ref 70–99)
Potassium: 3.8 mEq/L (ref 3.5–5.1)
SODIUM: 143 meq/L (ref 135–145)

## 2015-08-08 LAB — HEPATIC FUNCTION PANEL
ALBUMIN: 3.8 g/dL (ref 3.5–5.2)
ALK PHOS: 58 U/L (ref 39–117)
ALT: 20 U/L (ref 0–35)
AST: 24 U/L (ref 0–37)
BILIRUBIN DIRECT: 0.1 mg/dL (ref 0.0–0.3)
TOTAL PROTEIN: 6.4 g/dL (ref 6.0–8.3)
Total Bilirubin: 0.4 mg/dL (ref 0.2–1.2)

## 2015-08-08 LAB — TSH: TSH: 3.49 u[IU]/mL (ref 0.35–4.50)

## 2015-08-15 ENCOUNTER — Encounter: Payer: Self-pay | Admitting: Family Medicine

## 2015-08-15 ENCOUNTER — Ambulatory Visit (INDEPENDENT_AMBULATORY_CARE_PROVIDER_SITE_OTHER): Admitting: Family Medicine

## 2015-08-15 VITALS — BP 110/90 | HR 87 | Temp 98.5°F | Ht 65.0 in | Wt 201.0 lb

## 2015-08-15 DIAGNOSIS — Z0000 Encounter for general adult medical examination without abnormal findings: Secondary | ICD-10-CM

## 2015-08-15 DIAGNOSIS — F410 Panic disorder [episodic paroxysmal anxiety] without agoraphobia: Secondary | ICD-10-CM

## 2015-08-15 MED ORDER — PRAMIPEXOLE DIHYDROCHLORIDE 0.125 MG PO TABS: 0.1250 mg | ORAL_TABLET | Freq: Two times a day (BID) | ORAL | Status: DC

## 2015-08-15 MED ORDER — ESOMEPRAZOLE MAGNESIUM 40 MG PO CPDR: 40.0000 mg | DELAYED_RELEASE_CAPSULE | Freq: Every day | ORAL | Status: DC

## 2015-08-15 MED ORDER — ROSUVASTATIN CALCIUM 20 MG PO TABS: 20.0000 mg | ORAL_TABLET | Freq: Every day | ORAL | Status: DC

## 2015-08-15 MED ORDER — SERTRALINE HCL 50 MG PO TABS
50.0000 mg | ORAL_TABLET | Freq: Every day | ORAL | Status: DC
Start: 2015-08-15 — End: 2016-09-13

## 2015-08-15 NOTE — Progress Notes (Signed)
Pre visit review using our clinic review tool, if applicable. No additional management support is needed unless otherwise documented below in the visit note. 

## 2015-08-15 NOTE — Patient Instructions (Signed)

## 2015-08-15 NOTE — Progress Notes (Signed)
Subjective:    Patient ID: Tracy Strong, female    DOB: [birth year], 55 y.o.   MRN: [id number]  HPI Patient seen for complete physical. She is followed by Gyn for Pap smears and mammograms. Chronic problems include history of obesity, prediabetes, GERD, restless leg syndrome, asthma, dyslipidemia. She has past history of depression which is stable on sertraline.  She's had gradual weight gain over several years. Poor compliance with diet at times. No consistent exercise. Weight is up 8 pounds from last year. Has previously done Weight Watchers which she felt worked fairly well for her. Usually eats 3 meals per day sometimes skips meals. Frequent late night snacking. Usually eats out about 2 times per week Nonsmoker. Rare alcohol use. Colonoscopy up-to-date. Tetanus up-to-date.  Past Medical History  Diagnosis Date  . HYPERLIPIDEMIA 12/02/2008  . DEPRESSION 12/02/2008  . GERD 12/02/2008  . INSOMNIA 12/02/2008  . Headache(784.0) 12/02/2008  . URINARY INCONTINENCE 12/02/2008  . PREDIABETES 12/02/2008  . Diverticulosis   . Asthma   . Arthritis   . Complication of anesthesia     took awhile waking up  . Contact lens/glasses fitting     wears glasses or contacts  . Anxiety    Past Surgical History  Procedure Laterality Date  . Abdominal hysterectomy  2000    partial  . Mohs surgery  2007    nose  . Shoulder arthroscopy  2010    right  . Colonoscopy    . Shoulder arthroscopy  01/07/2012    Procedure: ARTHROSCOPY SHOULDER;  Surgeon: Hessie Dibble, MD;  Location: [facility];  Service: Orthopedics;  Laterality: Left;  LEFT SHOULDER SCOPE with acromioplasty and distal clavicle resection    reports that she has never smoked. She has never used smokeless tobacco. She reports that she drinks alcohol. She reports that she does not use illicit drugs. family history includes Cancer in her other; Heart disease in her other; Hypertension in her other. Allergies  Allergen  Reactions  . Latex     bruising  . Moxifloxacin     REACTION: AVALOX syncope episode, panic attacks       Review of Systems  Constitutional: Negative for fever, activity change, appetite change, fatigue and unexpected weight change.  HENT: Negative for ear pain, hearing loss, sore throat and trouble swallowing.   Eyes: Negative for visual disturbance.  Respiratory: Negative for cough and shortness of breath.   Cardiovascular: Negative for chest pain and palpitations.  Gastrointestinal: Negative for abdominal pain, diarrhea, constipation and blood in stool.  Endocrine: Negative for polydipsia and polyuria.  Genitourinary: Negative for dysuria and hematuria.  Musculoskeletal: Negative for myalgias, back pain and arthralgias.  Skin: Negative for rash.  Neurological: Negative for dizziness, syncope and headaches.  Hematological: Negative for adenopathy.  Psychiatric/Behavioral: Negative for confusion and dysphoric mood.       Objective:   Physical Exam  Constitutional: She is oriented to person, place, and time. She appears well-developed and well-nourished.  HENT:  Head: Normocephalic and atraumatic.  Eyes: EOM are normal. Pupils are equal, round, and reactive to light.  Neck: Normal range of motion. Neck supple. No thyromegaly present.  Cardiovascular: Normal rate, regular rhythm and normal heart sounds.   No murmur heard. Pulmonary/Chest: Breath sounds normal. No respiratory distress. She has no wheezes. She has no rales.  Abdominal: Soft. Bowel sounds are normal. She exhibits no distension and no mass. There is no tenderness. There is no rebound and no guarding.  Genitourinary:  Per gyn  Musculoskeletal: Normal range of motion. She exhibits no edema.  Lymphadenopathy:    She has no cervical adenopathy.  Neurological: She is alert and oriented to person, place, and time. She displays normal reflexes. No cranial nerve deficit.  Skin: No rash noted.  Psychiatric: She has a  normal mood and affect. Her behavior is normal. Judgment and thought content normal.          Assessment & Plan:  Physical exam. Labs reviewed. She has blood sugar 105. We discussed prediabetes-especially focus on dietary and lifestyle changes to try to reverse this. We've strongly recommended weight loss. She'll look at Weight Watchers which she has used in the past. We discussed importance of eating regular meals and healthy snacking. Reduce overall calories especially simple sugars and white starches. Establish more consistent exercise.  Eulas Post MD [facility]

## 2015-08-18 ENCOUNTER — Encounter: Payer: Self-pay | Admitting: Family Medicine

## 2015-10-09 ENCOUNTER — Encounter: Payer: Self-pay | Admitting: Family Medicine

## 2015-11-30 ENCOUNTER — Encounter: Payer: Self-pay | Admitting: Family Medicine

## 2015-12-12 ENCOUNTER — Encounter: Payer: Self-pay | Admitting: Family Medicine

## 2016-07-01 ENCOUNTER — Ambulatory Visit
Admission: RE | Admit: 2016-07-01 | Discharge: 2016-07-01 | Disposition: A | Discharge status: Home / Self Care | Source: Ambulatory Visit | Attending: Allergy | Admitting: Allergy

## 2016-07-01 ENCOUNTER — Other Ambulatory Visit: Payer: Self-pay | Admitting: Allergy

## 2016-07-01 DIAGNOSIS — J209 Acute bronchitis, unspecified: Secondary | ICD-10-CM

## 2016-08-13 ENCOUNTER — Encounter: Payer: Self-pay | Admitting: Family Medicine

## 2016-08-31 ENCOUNTER — Other Ambulatory Visit: Payer: Self-pay | Admitting: Family Medicine

## 2016-09-13 ENCOUNTER — Ambulatory Visit (INDEPENDENT_AMBULATORY_CARE_PROVIDER_SITE_OTHER): Admitting: Family Medicine

## 2016-09-13 ENCOUNTER — Encounter: Payer: Self-pay | Admitting: Family Medicine

## 2016-09-13 VITALS — BP 110/80 | HR 68 | Temp 98.4°F | Wt 206.5 lb

## 2016-09-13 DIAGNOSIS — R160 Hepatomegaly, not elsewhere classified: Secondary | ICD-10-CM | POA: Diagnosis not present

## 2016-09-13 DIAGNOSIS — E785 Hyperlipidemia, unspecified: Secondary | ICD-10-CM

## 2016-09-13 DIAGNOSIS — R739 Hyperglycemia, unspecified: Secondary | ICD-10-CM

## 2016-09-13 DIAGNOSIS — F410 Panic disorder [episodic paroxysmal anxiety] without agoraphobia: Secondary | ICD-10-CM | POA: Diagnosis not present

## 2016-09-13 LAB — LIPID PANEL
CHOLESTEROL: 190 mg/dL (ref 0–200)
HDL: 62.4 mg/dL (ref >39.00)
LDL CALC: 94 mg/dL (ref 0–99)
NonHDL: 127.81
TRIGLYCERIDES: 169 mg/dL — AB (ref 0.0–149.0)
Total CHOL/HDL Ratio: 3
VLDL: 33.8 mg/dL (ref 0.0–40.0)

## 2016-09-13 LAB — HEPATIC FUNCTION PANEL
ALK PHOS: 61 U/L (ref 39–117)
ALT: 23 U/L (ref 0–35)
AST: 27 U/L (ref 0–37)
Albumin: 4.2 g/dL (ref 3.5–5.2)
BILIRUBIN DIRECT: 0.1 mg/dL (ref 0.0–0.3)
TOTAL PROTEIN: 6.6 g/dL (ref 6.0–8.3)
Total Bilirubin: 0.5 mg/dL (ref 0.2–1.2)

## 2016-09-13 LAB — POCT GLYCOSYLATED HEMOGLOBIN (HGB A1C): HEMOGLOBIN A1C: 5.7

## 2016-09-13 LAB — GLUCOSE, POCT (MANUAL RESULT ENTRY): POC Glucose: 101 mg/dl — AB (ref 70–99)

## 2016-09-13 MED ORDER — ESOMEPRAZOLE MAGNESIUM 40 MG PO CPDR: 40.0000 mg | DELAYED_RELEASE_CAPSULE | Freq: Every day | ORAL | 0 refills | Status: DC

## 2016-09-13 MED ORDER — ROSUVASTATIN CALCIUM 20 MG PO TABS: 20.0000 mg | ORAL_TABLET | Freq: Every day | ORAL | 0 refills | Status: DC

## 2016-09-13 MED ORDER — ROSUVASTATIN CALCIUM 20 MG PO TABS: 20.0000 mg | ORAL_TABLET | Freq: Every day | ORAL | 3 refills | Status: DC

## 2016-09-13 MED ORDER — PRAMIPEXOLE DIHYDROCHLORIDE 0.125 MG PO TABS: ORAL_TABLET | ORAL | 3 refills | Status: DC

## 2016-09-13 MED ORDER — ESOMEPRAZOLE MAGNESIUM 40 MG PO CPDR: 40.0000 mg | DELAYED_RELEASE_CAPSULE | Freq: Every day | ORAL | 3 refills | Status: DC

## 2016-09-13 MED ORDER — SERTRALINE HCL 50 MG PO TABS: 50.0000 mg | ORAL_TABLET | Freq: Every day | ORAL | 3 refills | Status: DC

## 2016-09-13 NOTE — Patient Instructions (Signed)
Hyperglycemia Hyperglycemia occurs when the level of sugar (glucose) in the blood is too high. Glucose is a type of sugar that provides the body's main source of energy. Certain hormones (insulin and glucagon) control the level of glucose in the blood. Insulin lowers blood glucose, and glucagon increases blood glucose. Hyperglycemia can result from having too little insulin in the bloodstream, or from the body not responding normally to insulin. Hyperglycemia occurs most often in people who have diabetes (diabetes mellitus), but it can happen in people who do not have diabetes. It can develop quickly, and it can be life-threatening if it causes you to become severely dehydrated (diabetic ketoacidosis or hyperglycemic hyperosmolar state). Severe hyperglycemia is a medical emergency. What are the causes? If you have diabetes, hyperglycemia may be caused by:  Diabetes medicine.  Medicines that increase blood glucose or affect your diabetes control.  Not eating enough, or not eating often enough.  Changes in physical activity level.  Being sick or having an infection.  If you have prediabetes or undiagnosed diabetes:  Hyperglycemia may be caused by those conditions.  If you do not have diabetes, hyperglycemia may be caused by:  Certain medicines, including steroid medicines, beta-blockers, epinephrine, and thiazide diuretics.  Stress.  Serious illness.  Surgery.  Diseases of the pancreas.  Infection.  What increases the risk? Hyperglycemia is more likely to develop in people who have risk factors for diabetes, such as:  Having a family member with diabetes.  Having a gene for type 1 diabetes that is passed from parent to child (inherited).  Living in an area with cold weather conditions.  Exposure to certain viruses.  Certain conditions in which the body's disease-fighting (immune) system attacks itself (autoimmune disorders).  Being overweight or obese.  Having an  inactive (sedentary) lifestyle.  Having been diagnosed with insulin resistance.  Having a history of prediabetes, gestational diabetes, or polycystic ovarian syndrome (PCOS).  Being of American-Indian, African-American, Hispanic/Latino, or Asian/Pacific Islander descent.  What are the signs or symptoms? Hyperglycemia may not cause any symptoms. If you do have symptoms, they may include early warning signs, such as:  Increased thirst.  Hunger.  Feeling very tired.  Needing to urinate more often than usual.  Blurry vision.  Other symptoms may develop if hyperglycemia gets worse, such as:  Dry mouth.  Loss of appetite.  Fruity-smelling breath.  Weakness.  Unexpected or rapid weight gain or weight loss.  Tingling or numbness in the hands or feet.  Headache.  Skin that does not quickly return to normal after being lightly pinched and released (poor skin turgor).  Abdominal pain.  Cuts or bruises that are slow to heal.  How is this diagnosed? Hyperglycemia is diagnosed with a blood test to measure your blood glucose level. This blood test is usually done while you are having symptoms. Your health care provider may also do a physical exam and review your medical history. You may have more tests to determine the cause of your hyperglycemia, such as:  A fasting blood glucose (FBG) test. You will not be allowed to eat (you will fast) for at least 8 hours before a blood sample is taken.  An A1c (hemoglobin A1c) blood test. This provides information about blood glucose control over the previous 2-3 months.  An oral glucose tolerance test (OGTT). This measures your blood glucose at two times: ? After fasting. This is your baseline blood glucose level. ? Two hours after drinking a beverage that contains glucose.  How is   this treated? Treatment depends on the cause of your hyperglycemia. Treatment may include:  Taking medicine to regulate your blood glucose levels. If you  take insulin or other diabetes medicines, your medicine or dosage may be adjusted.  Lifestyle changes, such as exercising more, eating healthier foods, or losing weight.  Treating an illness or infection, if this caused your hyperglycemia.  Checking your blood glucose more often.  Stopping or reducing steroid medicines, if these caused your hyperglycemia.  If your hyperglycemia becomes severe and it results in hyperglycemic hyperosmolar state, you must be hospitalized and given IV fluids. Follow these instructions at home: General instructions  Take over-the-counter and prescription medicines only as told by your health care provider.  Do not use any products that contain nicotine or tobacco, such as cigarettes and e-cigarettes. If you need help quitting, ask your health care provider.  Limit alcohol intake to no more than 1 drink per day for nonpregnant women and 2 drinks per day for men. One drink equals 12 oz of beer, 5 oz of wine, or 1 oz of hard liquor.  Learn to manage stress. If you need help with this, ask your health care provider.  Keep all follow-up visits as told by your health care provider. This is important. Eating and drinking  Maintain a healthy weight.  Exercise regularly, as directed by your health care provider.  Stay hydrated, especially when you exercise, get sick, or spend time in hot temperatures.  Eat healthy foods, such as: ? Lean proteins. ? Complex carbohydrates. ? Fresh fruits and vegetables. ? Low-fat dairy products. ? Healthy fats.  Drink enough fluid to keep your urine clear or pale yellow. If you have diabetes:   Make sure you know the symptoms of hyperglycemia.  Follow your diabetes management plan, as told by your health care provider. Make sure you: ? Take your insulin and medicines as directed. ? Follow your exercise plan. ? Follow your meal plan. Eat on time, and do not skip meals. ? Check your blood glucose as often as directed.  Make sure to check your blood glucose before and after exercise. If you exercise longer or in a different way than usual, check your blood glucose more often. ? Follow your sick day plan whenever you cannot eat or drink normally. Make this plan in advance with your health care provider.  Share your diabetes management plan with people in your workplace, school, and household.  Check your urine for ketones when you are ill and as told by your health care provider.  Carry a medical alert card or wear medical alert jewelry. Contact a health care provider if:  Your blood glucose is at or above 240 mg/dL (13.3 mmol/L) for 2 days in a row.  You have problems keeping your blood glucose in your target range.  You have frequent episodes of hyperglycemia. Get help right away if:  You have difficulty breathing.  You have a change in how you think, feel, or act (mental status).  You have nausea or vomiting that does not go away. These symptoms may represent a serious problem that is an emergency. Do not wait to see if the symptoms will go away. Get medical help right away. Call your local emergency services (911 in the [place] Do not drive yourself to the hospital. Summary  Hyperglycemia occurs when the level of sugar (glucose) in the blood is too high.  Hyperglycemia is diagnosed with a blood test to measure your blood glucose level. This blood   test is usually done while you are having symptoms. Your health care provider may also do a physical exam and review your medical history.  If you have diabetes, follow your diabetes management plan as told by your health care provider.  Contact your health care provider if you have problems keeping your blood glucose in your target range. This information is not intended to replace advice given to you by your health care provider. Make sure you discuss any questions you have with your health care provider. Document Released: 09/04/2000 Document Revised:  11/27/2015 Document Reviewed: 11/27/2015 Elsevier Interactive Patient Education  2018 [employer] Inc[employer]  
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
## 2016-09-15 ENCOUNTER — Encounter: Payer: Self-pay | Admitting: Family Medicine

## 2016-09-17 ENCOUNTER — Other Ambulatory Visit: Payer: Self-pay | Admitting: Family Medicine

## 2016-09-17 DIAGNOSIS — R160 Hepatomegaly, not elsewhere classified: Secondary | ICD-10-CM

## 2016-10-05 ENCOUNTER — Ambulatory Visit
Admission: RE | Admit: 2016-10-05 | Discharge: 2016-10-05 | Disposition: A | Discharge status: Home / Self Care | Source: Ambulatory Visit | Attending: Family Medicine | Admitting: Family Medicine

## 2016-10-05 DIAGNOSIS — R160 Hepatomegaly, not elsewhere classified: Secondary | ICD-10-CM

## 2016-10-05 MED ORDER — GADOBENATE DIMEGLUMINE 529 MG/ML IV SOLN
20.0000 mL | Freq: Once | INTRAVENOUS | Status: AC | PRN
  Administered 2016-10-05: 18 mL via INTRAVENOUS

## 2016-10-10 ENCOUNTER — Other Ambulatory Visit: Payer: Self-pay | Admitting: Family Medicine

## 2016-10-26 ENCOUNTER — Other Ambulatory Visit: Payer: Self-pay | Admitting: Family Medicine

## 2016-10-30 ENCOUNTER — Ambulatory Visit (INDEPENDENT_AMBULATORY_CARE_PROVIDER_SITE_OTHER): Admitting: Family Medicine

## 2016-10-30 ENCOUNTER — Encounter: Payer: Self-pay | Admitting: Family Medicine

## 2016-10-30 VITALS — BP 110/80 | HR 96 | Temp 98.4°F | Wt 206.4 lb

## 2016-10-30 DIAGNOSIS — H1033 Unspecified acute conjunctivitis, bilateral: Secondary | ICD-10-CM | POA: Diagnosis not present

## 2016-10-30 MED ORDER — POLYMYXIN B-TRIMETHOPRIM 10000-0.1 UNIT/ML-% OP SOLN: 2.0000 [drp] | OPHTHALMIC | 0 refills | Status: DC

## 2016-10-30 NOTE — Progress Notes (Signed)
Subjective:     Patient ID: Tracy Strong, female   DOB: 10-Jun-1960, 56 y.o.   MRN: [id number]  HPI Patient is seen for 2 day history of redness right eye greater than left. Minimal irritation. Minimal itching. No sick contacts. No blurred vision. No sinusitis symptoms. She has had productive cough for about a week. No contact use. She's had only very minimal crusting.  Past Medical History:  Diagnosis Date  . Anxiety   . Arthritis   . Asthma   . Complication of anesthesia    took awhile waking up  . Contact lens/glasses fitting    wears glasses or contacts  . DEPRESSION 12/02/2008  . Diverticulosis   . GERD 12/02/2008  . Headache(784.0) 12/02/2008  . HYPERLIPIDEMIA 12/02/2008  . INSOMNIA 12/02/2008  . PREDIABETES 12/02/2008  . URINARY INCONTINENCE 12/02/2008   Past Surgical History:  Procedure Laterality Date  . ABDOMINAL HYSTERECTOMY  2000   partial  . COLONOSCOPY    . MOHS SURGERY  2007   nose  . SHOULDER ARTHROSCOPY  2010   right  . SHOULDER ARTHROSCOPY  01/07/2012   Procedure: ARTHROSCOPY SHOULDER;  Surgeon: Hessie Dibble, MD;  Location: [facility];  Service: Orthopedics;  Laterality: Left;  LEFT SHOULDER SCOPE with acromioplasty and distal clavicle resection    reports that she has never smoked. She has never used smokeless tobacco. She reports that she drinks alcohol. She reports that she does not use drugs. family history includes Cancer in her other; Heart disease in her other; Hypertension in her other. Allergies  Allergen Reactions  . Latex     bruising  . Moxifloxacin     REACTION: AVALOX syncope episode, panic attacks     Review of Systems  Constitutional: Negative for chills and fever.  HENT: Positive for sinus pain and sinus pressure.   Eyes: Positive for discharge and redness. Negative for pain and visual disturbance.       Objective:   Physical Exam  Constitutional: She appears well-developed and well-nourished.  HENT:  Right Ear:  External ear normal.  Left Ear: External ear normal.  Mouth/Throat: Oropharynx is clear and moist.  Eyes:  Conjunctivae are slightly erythematous bilaterally right greater than left. She has little bit of crusted drainage along the margin of the upper and lower lid on the right side  Cardiovascular: Normal rate and regular rhythm.   Pulmonary/Chest: Effort normal and breath sounds normal. No respiratory distress. She has no wheezes. She has no rales.       Assessment:     Probable bilateral bacterial conjunctivitis    Plan:     -Warm compresses to both eyes several times daily -Polytrim ophthalmic drops 2 drops both eyes every 4 hours while awake -Touch base in 3 days if not improving  Eulas Post MD [facility] at [facility]

## 2016-10-30 NOTE — Patient Instructions (Signed)

## 2016-11-16 ENCOUNTER — Encounter: Payer: Self-pay | Admitting: Family Medicine

## 2016-12-12 ENCOUNTER — Encounter: Payer: Self-pay | Admitting: Family Medicine

## 2017-08-06 ENCOUNTER — Ambulatory Visit (INDEPENDENT_AMBULATORY_CARE_PROVIDER_SITE_OTHER): Admitting: Family Medicine

## 2017-08-06 ENCOUNTER — Encounter: Payer: Self-pay | Admitting: Family Medicine

## 2017-08-06 ENCOUNTER — Telehealth: Payer: Self-pay | Admitting: Family Medicine

## 2017-08-06 VITALS — BP 120/80 | HR 92 | Temp 98.6°F | Wt 209.6 lb

## 2017-08-06 DIAGNOSIS — R739 Hyperglycemia, unspecified: Secondary | ICD-10-CM

## 2017-08-06 DIAGNOSIS — G2581 Restless legs syndrome: Secondary | ICD-10-CM | POA: Insufficient documentation

## 2017-08-06 DIAGNOSIS — E785 Hyperlipidemia, unspecified: Secondary | ICD-10-CM | POA: Diagnosis not present

## 2017-08-06 DIAGNOSIS — F410 Panic disorder [episodic paroxysmal anxiety] without agoraphobia: Secondary | ICD-10-CM | POA: Diagnosis not present

## 2017-08-06 MED ORDER — ROSUVASTATIN CALCIUM 20 MG PO TABS: 20.0000 mg | ORAL_TABLET | Freq: Every day | ORAL | 3 refills | Status: DC

## 2017-08-06 MED ORDER — ESOMEPRAZOLE MAGNESIUM 40 MG PO PACK: 40.0000 mg | PACK | Freq: Every day | ORAL | 3 refills | Status: DC

## 2017-08-06 MED ORDER — SERTRALINE HCL 50 MG PO TABS
50.0000 mg | ORAL_TABLET | Freq: Every day | ORAL | 3 refills | Status: DC
Start: 2017-08-06 — End: 2018-07-07

## 2017-08-06 NOTE — Progress Notes (Signed)
Subjective:     Patient ID: Tracy Strong, female   DOB: Feb 17, 1961, 57 y.o.   MRN: [id number]  HPI Patient seen for medical follow-up. She has multiple problems including history of hyperlipidemia, history depression, GERD, chronic insomnia, prediabetes, and restless leg syndrome.  Requesting several refills today including Nexium, Crestor, Zoloft. Depression symptoms are stable. She remains on Crestor with no myalgias. No recent chest pains. GERD symptoms controlled with Nexium. She's had breakthrough each time she's tried to quit.  Currently on Mirapex 0.125 mg 2 at night and still had breakthrough restless leg symptoms. No caffeine use at night. No history of anemia.  She's had some redundancy of skin right upper lid compared to left. Recent eye exam and no concerning findings per ophthalmology  Past Medical History:  Diagnosis Date  . Anxiety   . Arthritis   . Asthma   . Complication of anesthesia    took awhile waking up  . Contact lens/glasses fitting    wears glasses or contacts  . DEPRESSION 12/02/2008  . Diverticulosis   . GERD 12/02/2008  . Headache(784.0) 12/02/2008  . HYPERLIPIDEMIA 12/02/2008  . INSOMNIA 12/02/2008  . PREDIABETES 12/02/2008  . URINARY INCONTINENCE 12/02/2008   Past Surgical History:  Procedure Laterality Date  . ABDOMINAL HYSTERECTOMY  2000   partial  . COLONOSCOPY    . MOHS SURGERY  2007   nose  . SHOULDER ARTHROSCOPY  2010   right  . SHOULDER ARTHROSCOPY  01/07/2012   Procedure: ARTHROSCOPY SHOULDER;  Surgeon: Hessie Dibble, MD;  Location: [facility];  Service: Orthopedics;  Laterality: Left;  LEFT SHOULDER SCOPE with acromioplasty and distal clavicle resection    reports that she has never smoked. She has never used smokeless tobacco. She reports that she drinks alcohol. She reports that she does not use drugs. family history includes Cancer in her other; Heart disease in her other; Hypertension in her other. Allergies   Allergen Reactions  . Latex     bruising  . Moxifloxacin     REACTION: AVALOX syncope episode, panic attacks     Review of Systems  Constitutional: Negative for fatigue.  HENT: Negative for trouble swallowing.   Eyes: Negative for visual disturbance.  Respiratory: Negative for cough, chest tightness, shortness of breath and wheezing.   Cardiovascular: Negative for chest pain, palpitations and leg swelling.  Gastrointestinal: Negative for abdominal pain.  Endocrine: Negative for polydipsia and polyuria.  Neurological: Negative for dizziness, seizures, syncope, weakness, light-headedness and headaches.  Psychiatric/Behavioral: Negative for dysphoric mood and suicidal ideas.       Objective:   Physical Exam  Constitutional: She appears well-developed and well-nourished.  HENT:  Right Ear: External ear normal.  Left Ear: External ear normal.  Neck: Neck supple.  Cardiovascular: Normal heart sounds.  Pulmonary/Chest: Effort normal and breath sounds normal. She has no wheezes. She has no rales.  Musculoskeletal: She exhibits no edema.  Lymphadenopathy:    She has no cervical adenopathy.  Psychiatric: She has a normal mood and affect. Her behavior is normal.       Assessment:     #1 GERD stable on Nexium  #2 hyperlipidemia  #3 history of recurrent depression  #4 restless leg syndrome not controlled with current dose of Mirapex    Plan:     -Refill Nexium, Crestor, and Zoloft for one year -Check further labs with lipid panel, hepatic panel, hemoglobin O1H, basic metabolic panel -Increase Mirapex to 3 tablets at night and after  1 week if symptoms persist good of 4 tablets. Given recent feedback regarding how she is a spotting to increase dose. Would not titrate over 0.5 mg  Eulas Post MD [facility]

## 2017-08-06 NOTE — Telephone Encounter (Signed)
Left Message on voice mail to return call to reschedule appointment for 08/08/2017. The provider is working half a day.

## 2017-08-06 NOTE — Patient Instructions (Signed)
Increase the Mirapex to three at night  If no improvement in one week increase to four at night and give me some feedback.Marland Kitchen

## 2017-08-07 LAB — LIPID PANEL
CHOLESTEROL: 206 mg/dL — AB (ref 0–200)
HDL: 73.8 mg/dL (ref >39.00)
LDL CALC: 102 mg/dL — AB (ref 0–99)
NonHDL: 132.56
TRIGLYCERIDES: 155 mg/dL — AB (ref 0.0–149.0)
Total CHOL/HDL Ratio: 3
VLDL: 31 mg/dL (ref 0.0–40.0)

## 2017-08-07 LAB — BASIC METABOLIC PANEL
BUN: 21 mg/dL (ref 6–23)
CHLORIDE: 104 meq/L (ref 96–112)
CO2: 28 mEq/L (ref 19–32)
Calcium: 9.1 mg/dL (ref 8.4–10.5)
Creatinine, Ser: 0.81 mg/dL (ref 0.40–1.20)
GFR: 77.14 mL/min (ref >60.00)
Glucose, Bld: 86 mg/dL (ref 70–99)
POTASSIUM: 4.2 meq/L (ref 3.5–5.1)
SODIUM: 141 meq/L (ref 135–145)

## 2017-08-07 LAB — HEPATIC FUNCTION PANEL
ALBUMIN: 3.9 g/dL (ref 3.5–5.2)
ALT: 21 U/L (ref 0–35)
AST: 21 U/L (ref 0–37)
Alkaline Phosphatase: 63 U/L (ref 39–117)
BILIRUBIN TOTAL: 0.3 mg/dL (ref 0.2–1.2)
Bilirubin, Direct: 0.1 mg/dL (ref 0.0–0.3)
TOTAL PROTEIN: 6.7 g/dL (ref 6.0–8.3)

## 2017-08-07 LAB — HEMOGLOBIN A1C: HEMOGLOBIN A1C: 6.1 % (ref 4.6–6.5)

## 2017-08-08 ENCOUNTER — Ambulatory Visit: Admitting: Family Medicine

## 2017-08-15 ENCOUNTER — Encounter: Payer: Self-pay | Admitting: Family Medicine

## 2017-08-15 MED ORDER — PRAMIPEXOLE DIHYDROCHLORIDE 0.125 MG PO TABS: ORAL_TABLET | ORAL | 3 refills | Status: DC

## 2017-09-03 ENCOUNTER — Ambulatory Visit (INDEPENDENT_AMBULATORY_CARE_PROVIDER_SITE_OTHER): Admitting: Family Medicine

## 2017-09-03 ENCOUNTER — Encounter: Payer: Self-pay | Admitting: Family Medicine

## 2017-09-03 VITALS — BP 120/80 | HR 72 | Temp 97.9°F | Wt 214.9 lb

## 2017-09-03 DIAGNOSIS — R61 Generalized hyperhidrosis: Secondary | ICD-10-CM

## 2017-09-03 NOTE — Progress Notes (Signed)
  Subjective:     Patient ID: Tracy Strong, female   DOB: February 07, 1961, 57 y.o.   MRN: [id number]  HPI Patient seen with several year history of hot flashes. She sees her gynecologist and is on fairly high-dose estrogen placement without much success. She is here basically today requesting tuberculosis screening. This was at the request of her GYN. She has not had any cough, fever, chills, or weight loss. No dyspnea. Her hot flashes occur day and night  No recent travels out of country. No specific risk factors for TB  Past Medical History:  Diagnosis Date  . Anxiety   . Arthritis   . Asthma   . Complication of anesthesia    took awhile waking up  . Contact lens/glasses fitting    wears glasses or contacts  . DEPRESSION 12/02/2008  . Diverticulosis   . GERD 12/02/2008  . Headache(784.0) 12/02/2008  . HYPERLIPIDEMIA 12/02/2008  . INSOMNIA 12/02/2008  . PREDIABETES 12/02/2008  . URINARY INCONTINENCE 12/02/2008   Past Surgical History:  Procedure Laterality Date  . ABDOMINAL HYSTERECTOMY  2000   partial  . COLONOSCOPY    . MOHS SURGERY  2007   nose  . SHOULDER ARTHROSCOPY  2010   right  . SHOULDER ARTHROSCOPY  01/07/2012   Procedure: ARTHROSCOPY SHOULDER;  Surgeon: Hessie Dibble, MD;  Location: [facility];  Service: Orthopedics;  Laterality: Left;  LEFT SHOULDER SCOPE with acromioplasty and distal clavicle resection    reports that she has never smoked. She has never used smokeless tobacco. She reports that she drinks alcohol. She reports that she does not use drugs. family history includes Cancer in her other; Heart disease in her other; Hypertension in her other. Allergies  Allergen Reactions  . Latex     bruising  . Moxifloxacin     REACTION: AVALOX syncope episode, panic attacks     Review of Systems  Constitutional: Negative for chills, fatigue, fever and unexpected weight change.  Respiratory: Negative for cough and shortness of breath.    Hematological: Negative for adenopathy.       Objective:   Physical Exam  Constitutional: She appears well-developed and well-nourished.  HENT:  Mouth/Throat: Oropharynx is clear and moist.  Neck: Neck supple.  Cardiovascular: Normal rate and regular rhythm.  Pulmonary/Chest: Effort normal and breath sounds normal. No stridor. She has no wheezes. She has no rales.  Lymphadenopathy:    She has no cervical adenopathy.       Assessment:     Hot flashes and intermittent night sweats. She has not had any red flag symptoms such as appetite or weight change, fever, chills, or specific risk factors for tuberculosis but is requesting tuberculosis screening from her GYN    Plan:     -Obtain quantiferon gold TB assay  Eulas Post MD [facility]

## 2017-09-05 ENCOUNTER — Encounter: Payer: Self-pay | Admitting: Family Medicine

## 2017-09-05 LAB — QUANTIFERON-TB GOLD PLUS
Mitogen-NIL: >10 IU/mL
NIL: 0.02 IU/mL
QUANTIFERON-TB GOLD PLUS: NEGATIVE
TB1-NIL: 0.05 [IU]/mL
TB2-NIL: 0.13 [IU]/mL

## 2017-11-19 ENCOUNTER — Encounter: Payer: Self-pay | Admitting: Family Medicine

## 2017-11-20 ENCOUNTER — Other Ambulatory Visit: Payer: Self-pay | Admitting: Obstetrics & Gynecology

## 2017-11-20 DIAGNOSIS — R928 Other abnormal and inconclusive findings on diagnostic imaging of breast: Secondary | ICD-10-CM

## 2017-11-26 ENCOUNTER — Ambulatory Visit

## 2017-11-26 ENCOUNTER — Ambulatory Visit
Admission: RE | Admit: 2017-11-26 | Discharge: 2017-11-26 | Disposition: A | Discharge status: Home / Self Care | Source: Ambulatory Visit | Attending: Obstetrics & Gynecology | Admitting: Obstetrics & Gynecology

## 2017-11-26 DIAGNOSIS — R928 Other abnormal and inconclusive findings on diagnostic imaging of breast: Secondary | ICD-10-CM

## 2017-11-26 LAB — HM MAMMOGRAPHY

## 2017-12-02 ENCOUNTER — Encounter: Payer: Self-pay | Admitting: Family Medicine

## 2017-12-02 MED ORDER — MELOXICAM 15 MG PO TABS: 15.0000 mg | ORAL_TABLET | Freq: Every day | ORAL | 1 refills | Status: DC

## 2017-12-02 MED ORDER — MELOXICAM 15 MG PO TABS: 15.0000 mg | ORAL_TABLET | Freq: Every day | ORAL | 0 refills | Status: DC

## 2017-12-02 NOTE — Telephone Encounter (Signed)
Refills sent as requested

## 2017-12-02 NOTE — Telephone Encounter (Signed)
Dr. Elease Hashimoto.At my last visit for RX refills, I forgot to mention that I was out of Meloxicam 15mg  tabs.It was this morning that it dawned on me that may be the reason why I'm all achey and my body hurts and I can't sleep at night.  Can you call me in a 30 day script ([facility]) and send a 90day/3 mo to my Mail Order.Marland KitchenMarland KitchenMarland KitchenMeds By Mail.  or do I need to come in and discuss this with you??  Tracy Strong  Dr. Elease Hashimoto,  Please advise if you are ok with refilling this medication for the pt.  Thanks

## 2017-12-27 ENCOUNTER — Other Ambulatory Visit: Payer: Self-pay | Admitting: Family Medicine

## 2017-12-28 ENCOUNTER — Encounter: Payer: Self-pay | Admitting: Family Medicine

## 2017-12-29 ENCOUNTER — Other Ambulatory Visit: Payer: Self-pay

## 2017-12-29 MED ORDER — MELOXICAM 15 MG PO TABS: 15.0000 mg | ORAL_TABLET | Freq: Every day | ORAL | 1 refills | Status: DC

## 2018-03-25 DIAGNOSIS — C801 Malignant (primary) neoplasm, unspecified: Secondary | ICD-10-CM

## 2018-03-25 HISTORY — DX: Malignant (primary) neoplasm, unspecified: C80.1

## 2018-04-20 HISTORY — PX: MOHS SURGERY: SUR867

## 2018-05-27 ENCOUNTER — Ambulatory Visit (INDEPENDENT_AMBULATORY_CARE_PROVIDER_SITE_OTHER): Admitting: Family Medicine

## 2018-05-27 ENCOUNTER — Other Ambulatory Visit: Payer: Self-pay

## 2018-05-27 ENCOUNTER — Encounter: Payer: Self-pay | Admitting: Family Medicine

## 2018-05-27 VITALS — BP 124/78 | HR 75 | Temp 98.8°F | Ht 63.5 in | Wt 211.4 lb

## 2018-05-27 DIAGNOSIS — H1031 Unspecified acute conjunctivitis, right eye: Secondary | ICD-10-CM

## 2018-05-27 DIAGNOSIS — H00012 Hordeolum externum right lower eyelid: Secondary | ICD-10-CM | POA: Diagnosis not present

## 2018-05-27 MED ORDER — DOXYCYCLINE HYCLATE 100 MG PO CAPS: 100.0000 mg | ORAL_CAPSULE | Freq: Two times a day (BID) | ORAL | 0 refills | Status: DC

## 2018-05-27 MED ORDER — TOBRAMYCIN 0.3 % OP SOLN: 2.0000 [drp] | OPHTHALMIC | 0 refills | Status: DC

## 2018-05-27 NOTE — Progress Notes (Signed)
  Subjective:     Patient ID: Tracy Strong, female   DOB: 1961/01/18, 58 y.o.   MRN: [id number]  HPI Patient is seen with right eye irritation.  She noticed some swelling of the right lower lid along with some redness and drainage over the past several days.  No blurred vision.  No eye injury.  She noticed what looked like a small stye on the mid lower lid.  She is used some warm compresses.  Seems to be getting worse.  Frequent matting.  Past Medical History:  Diagnosis Date  . Anxiety   . Arthritis   . Asthma   . Complication of anesthesia    took awhile waking up  . Contact lens/glasses fitting    wears glasses or contacts  . DEPRESSION 12/02/2008  . Diverticulosis   . GERD 12/02/2008  . Headache(784.0) 12/02/2008  . HYPERLIPIDEMIA 12/02/2008  . INSOMNIA 12/02/2008  . PREDIABETES 12/02/2008  . URINARY INCONTINENCE 12/02/2008   Past Surgical History:  Procedure Laterality Date  . ABDOMINAL HYSTERECTOMY  2000   partial  . COLONOSCOPY    . MOHS SURGERY  2007   nose  . MOHS SURGERY  04/20/2018  . SHOULDER ARTHROSCOPY  2010   right  . SHOULDER ARTHROSCOPY  01/07/2012   Procedure: ARTHROSCOPY SHOULDER;  Surgeon: Hessie Dibble, MD;  Location: [facility];  Service: Orthopedics;  Laterality: Left;  LEFT SHOULDER SCOPE with acromioplasty and distal clavicle resection    reports that she has never smoked. She has never used smokeless tobacco. She reports current alcohol use. She reports that she does not use drugs. family history includes Cancer in an other family member; Heart disease in an other family member; Hypertension in an other family member. Allergies  Allergen Reactions  . Latex     bruising  . Moxifloxacin     REACTION: AVALOX syncope episode, panic attacks     Review of Systems  Constitutional: Negative for chills and fever.  Eyes: Positive for discharge and redness. Negative for photophobia and visual disturbance.       Objective:   Physical  Exam Constitutional:      Appearance: Normal appearance.  Eyes:     Comments: Right conjunctive is slightly erythematous compared to the left.  She has a bit of yellow crusted drainage on the lower lid.  She does appear to have a very small stye along the mid aspect of the right lower lid.  She does have some mild diffuse erythema right lower lid  Neurological:     Mental Status: She is alert.        Assessment:     Small stye right lower lid along with what appears to be some bacterial conjunctivitis changes    Plan:     -Continue with warm compresses several times daily -Tobramycin eyedrops 2 drops right eye every 4 hours while awake -Doxycycline 100 mg twice daily for 7 days -Touch base if not improving over the next couple of days  Eulas Post MD [facility]

## 2018-05-27 NOTE — Patient Instructions (Signed)
Continue with warm compresses several times daily.    Let me know if not clearing in a few days.

## 2018-05-31 ENCOUNTER — Encounter: Payer: Self-pay | Admitting: Family Medicine

## 2018-07-07 ENCOUNTER — Encounter: Payer: Self-pay | Admitting: Family Medicine

## 2018-07-07 ENCOUNTER — Other Ambulatory Visit: Payer: Self-pay

## 2018-07-07 DIAGNOSIS — F410 Panic disorder [episodic paroxysmal anxiety] without agoraphobia: Secondary | ICD-10-CM

## 2018-07-07 MED ORDER — MELOXICAM 15 MG PO TABS: 15.0000 mg | ORAL_TABLET | Freq: Every day | ORAL | 1 refills | Status: DC

## 2018-07-07 MED ORDER — ESOMEPRAZOLE MAGNESIUM 40 MG PO PACK
40.0000 mg | PACK | Freq: Every day | ORAL | 1 refills | Status: DC
Start: 2018-07-07 — End: 2019-03-10

## 2018-07-07 MED ORDER — ROSUVASTATIN CALCIUM 20 MG PO TABS: 20.0000 mg | ORAL_TABLET | Freq: Every day | ORAL | 1 refills | Status: DC

## 2018-07-07 MED ORDER — SERTRALINE HCL 50 MG PO TABS: 50.0000 mg | ORAL_TABLET | Freq: Every day | ORAL | 1 refills | Status: DC

## 2018-07-07 MED ORDER — PRAMIPEXOLE DIHYDROCHLORIDE 0.125 MG PO TABS: ORAL_TABLET | ORAL | 1 refills | Status: DC

## 2018-07-28 ENCOUNTER — Encounter: Payer: Self-pay | Admitting: Family Medicine

## 2018-07-29 ENCOUNTER — Ambulatory Visit (INDEPENDENT_AMBULATORY_CARE_PROVIDER_SITE_OTHER): Admitting: Family Medicine

## 2018-07-29 ENCOUNTER — Other Ambulatory Visit: Payer: Self-pay

## 2018-07-29 DIAGNOSIS — R21 Rash and other nonspecific skin eruption: Secondary | ICD-10-CM | POA: Diagnosis not present

## 2018-07-29 MED ORDER — DOXYCYCLINE HYCLATE 100 MG PO CAPS: 100.0000 mg | ORAL_CAPSULE | Freq: Two times a day (BID) | ORAL | 0 refills | Status: DC

## 2018-07-29 NOTE — Progress Notes (Signed)
Patient ID: Tracy Strong, female   DOB: 09-Jul-1960, 58 y.o.   MRN: [id number]  This visit type was conducted due to national recommendations for restrictions regarding the COVID-19 pandemic in an effort to limit this patient's exposure and mitigate transmission in our community.   Virtual Visit via Video Note  I connected with Tracy Strong on 07/29/18 at 11:00 AM EDT by a video enabled telemedicine application and verified that I am speaking with the correct person using two identifiers.  Location patient: home Location provider:work or home office Persons participating in the virtual visit: patient, provider  I discussed the limitations of evaluation and management by telemedicine and the availability of in person appointments. The patient expressed understanding and agreed to proceed.   HPI: Skin rash left leg just below the knee.  She first noticed this about 5 days ago.  She sent a picture in yesterday and this showed punctate center with surrounding erythematous ring.  Nonscaly.  She states the ring is advanced and is about another centimeter wider than it was yesterday.  She has not had any fevers or chills.  No headache.  No increased arthralgias.  Rashes nonscaly.  No pustule.  No history of tick bite.  She has some itching but also some mild pain.   ROS: See pertinent positives and negatives per HPI.  Past Medical History:  Diagnosis Date  . Anxiety   . Arthritis   . Asthma   . Complication of anesthesia    took awhile waking up  . Contact lens/glasses fitting    wears glasses or contacts  . DEPRESSION 12/02/2008  . Diverticulosis   . GERD 12/02/2008  . Headache(784.0) 12/02/2008  . HYPERLIPIDEMIA 12/02/2008  . INSOMNIA 12/02/2008  . PREDIABETES 12/02/2008  . URINARY INCONTINENCE 12/02/2008    Past Surgical History:  Procedure Laterality Date  . ABDOMINAL HYSTERECTOMY  2000   partial  . COLONOSCOPY    . MOHS SURGERY  2007   nose  . MOHS SURGERY  04/20/2018  . SHOULDER  ARTHROSCOPY  2010   right  . SHOULDER ARTHROSCOPY  01/07/2012   Procedure: ARTHROSCOPY SHOULDER;  Surgeon: Hessie Dibble, MD;  Location: [facility];  Service: Orthopedics;  Laterality: Left;  LEFT SHOULDER SCOPE with acromioplasty and distal clavicle resection    Family History  Problem Relation Age of Onset  . Hypertension Other   . Cancer Other        lung, prostate  . Heart disease Other     SOCIAL HX: Non-smoker   Current Outpatient Medications:  .  albuterol (PROVENTIL HFA;VENTOLIN HFA) 108 (90 Base) MCG/ACT inhaler, Inhale 2 puffs into the lungs every 4 (four) hours as needed for wheezing., Disp: 18 g, Rfl: 2 .  budesonide-formoterol (SYMBICORT) 160-4.5 MCG/ACT inhaler, Inhale 2 puffs into the lungs 2 (two) times daily as needed., Disp: , Rfl:  .  doxycycline (VIBRAMYCIN) 100 MG capsule, Take 1 capsule (100 mg total) by mouth 2 (two) times daily., Disp: 20 capsule, Rfl: 0 .  esomeprazole (NEXIUM) 40 MG packet, Take 40 mg by mouth daily before breakfast. BRAND NAME ONLY, Disp: 90 each, Rfl: 1 .  estrogens, conjugated, (PREMARIN) 0.9 MG tablet, Take 0.9 mg by mouth. 2 tabs in the AM and 1 tab in the PM, Disp: , Rfl:  .  meloxicam (MOBIC) 15 MG tablet, Take 1 tablet (15 mg total) by mouth daily., Disp: 90 tablet, Rfl: 1 .  montelukast (SINGULAIR) 10 MG tablet, Take 1 tablet (  10 mg total) by mouth at bedtime., Disp: 90 tablet, Rfl: 3 .  Multiple Vitamin (MULTIVITAMIN) tablet, Take 1 tablet by mouth daily. Nutralite --- takes 2x per day, Disp: , Rfl:  .  pramipexole (MIRAPEX) 0.125 MG tablet, Take 3 tabs by mouth at bedtime, Disp: 270 tablet, Rfl: 1 .  rosuvastatin (CRESTOR) 20 MG tablet, Take 1 tablet (20 mg total) by mouth daily., Disp: 90 tablet, Rfl: 1 .  sertraline (ZOLOFT) 50 MG tablet, Take 1 tablet (50 mg total) by mouth daily., Disp: 90 tablet, Rfl: 1 .  tobramycin (TOBREX) 0.3 % ophthalmic solution, Place 2 drops into the right eye every 4 (four) hours.,  Disp: 5 mL, Rfl: 0 .  trimethoprim-polymyxin b (POLYTRIM) ophthalmic solution, Place 2 drops into both eyes every 4 (four) hours., Disp: 10 mL, Rfl: 0  EXAM:  VITALS per patient if applicable:  GENERAL: alert, oriented, appears well and in no acute distress  HEENT: atraumatic, conjunttiva clear, no obvious abnormalities on inspection of external nose and ears  NECK: normal movements of the head and neck  LUNGS: on inspection no signs of respiratory distress, breathing rate appears normal, no obvious gross SOB, gasping or wheezing  CV: no obvious cyanosis  MS: moves all visible extremities without noticeable abnormality  PSYCH/NEURO: pleasant and cooperative, no obvious depression or anxiety, speech and thought processing grossly intact  ASSESSMENT AND PLAN:  Discussed the following assessment and plan:  Skin rash she has a erythematous ring with advancing border.  This does not appear typical for ringworm since there is no scaling.  She does not have any fever or arthralgias but concern from appearance is whether this could represent erythema migrans.  No signs of necrosis.  This does not appear typical for cellulitis.  Not a typical urticarial lesion either.    -We recommended going ahead and covering with doxycycline 100 mg twice daily for 10 days -Watch for any fever, progressive rash, or other concerns     I discussed the assessment and treatment plan with the patient. The patient was provided an opportunity to ask questions and all were answered. The patient agreed with the plan and demonstrated an understanding of the instructions.   The patient was advised to call back or seek an in-person evaluation if the symptoms worsen or if the condition fails to improve as anticipated.     Carolann Littler, MD

## 2018-11-04 ENCOUNTER — Encounter: Payer: Self-pay | Admitting: Family Medicine

## 2018-11-04 DIAGNOSIS — R739 Hyperglycemia, unspecified: Secondary | ICD-10-CM

## 2018-11-04 DIAGNOSIS — E785 Hyperlipidemia, unspecified: Secondary | ICD-10-CM

## 2018-11-06 NOTE — Telephone Encounter (Signed)
Orders placed.

## 2018-11-12 ENCOUNTER — Other Ambulatory Visit: Payer: Self-pay

## 2018-11-12 ENCOUNTER — Other Ambulatory Visit (INDEPENDENT_AMBULATORY_CARE_PROVIDER_SITE_OTHER)

## 2018-11-12 DIAGNOSIS — E785 Hyperlipidemia, unspecified: Secondary | ICD-10-CM | POA: Diagnosis not present

## 2018-11-12 DIAGNOSIS — R739 Hyperglycemia, unspecified: Secondary | ICD-10-CM | POA: Diagnosis not present

## 2018-11-12 LAB — LIPID PANEL
Cholesterol: 164 mg/dL (ref 0–200)
HDL: 61 mg/dL (ref >39.00)
LDL Cholesterol: 72 mg/dL (ref 0–99)
NonHDL: 103.19
Total CHOL/HDL Ratio: 3
Triglycerides: 158 mg/dL — ABNORMAL HIGH (ref 0.0–149.0)
VLDL: 31.6 mg/dL (ref 0.0–40.0)

## 2018-11-12 LAB — BASIC METABOLIC PANEL
BUN: 15 mg/dL (ref 6–23)
CO2: 30 mEq/L (ref 19–32)
Calcium: 9.2 mg/dL (ref 8.4–10.5)
Chloride: 102 mEq/L (ref 96–112)
Creatinine, Ser: 0.76 mg/dL (ref 0.40–1.20)
GFR: 77.78 mL/min (ref >60.00)
Glucose, Bld: 107 mg/dL — ABNORMAL HIGH (ref 70–99)
Potassium: 4.3 mEq/L (ref 3.5–5.1)
Sodium: 141 mEq/L (ref 135–145)

## 2018-11-12 LAB — HEPATIC FUNCTION PANEL
ALT: 16 U/L (ref 0–35)
AST: 18 U/L (ref 0–37)
Albumin: 3.9 g/dL (ref 3.5–5.2)
Alkaline Phosphatase: 68 U/L (ref 39–117)
Bilirubin, Direct: 0.1 mg/dL (ref 0.0–0.3)
Total Bilirubin: 0.3 mg/dL (ref 0.2–1.2)
Total Protein: 6.2 g/dL (ref 6.0–8.3)

## 2018-11-12 LAB — HEMOGLOBIN A1C: Hgb A1c MFr Bld: 6.2 % (ref 4.6–6.5)

## 2018-11-25 LAB — HM MAMMOGRAPHY

## 2018-11-25 LAB — HM DEXA SCAN: HM Dexa Scan: NORMAL

## 2018-12-17 ENCOUNTER — Ambulatory Visit (INDEPENDENT_AMBULATORY_CARE_PROVIDER_SITE_OTHER)

## 2018-12-17 ENCOUNTER — Other Ambulatory Visit: Payer: Self-pay

## 2018-12-17 DIAGNOSIS — Z23 Encounter for immunization: Secondary | ICD-10-CM | POA: Diagnosis not present

## 2019-01-07 ENCOUNTER — Ambulatory Visit

## 2019-01-29 ENCOUNTER — Other Ambulatory Visit: Payer: Self-pay

## 2019-01-29 DIAGNOSIS — Z20822 Contact with and (suspected) exposure to covid-19: Secondary | ICD-10-CM

## 2019-01-30 LAB — NOVEL CORONAVIRUS, NAA: SARS-CoV-2, NAA: NOT DETECTED

## 2019-02-23 IMAGING — MR MR ABDOMEN WO/W CM
11 of 17 series · 24 of 48 positions shown · IV contrast (multihance)
Comparison: No priors.

CLINICAL DATA: 56-year-old female with history of liver cysts.
Follow-up study.

EXAM:
MRI ABDOMEN WITHOUT AND WITH CONTRAST
TECHNIQUE: Multiplanar multisequence MR imaging of the abdomen was performed
both before and after the administration of intravenous contrast.
CONTRAST:  18mL MULTIHANCE GADOBENATE DIMEGLUMINE 529 MG/ML IV SOLN

[Series 3: T2 · coronal · 5.0mm · 1.56mm/px · 1 of 37 slices shown (1 of 3)]
[im 1/37]
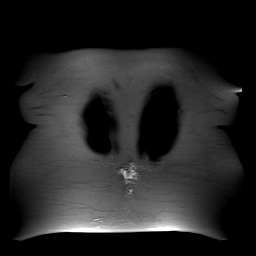

[Series 4: axial tru fisp · axial · 4.0mm · 1.48mm/px · 1 of 40 slices shown]
[im 1/40]
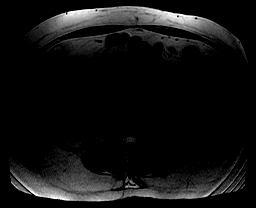

[Series 5: T2 · axial · 6.5mm · 0.74mm/px · 1 of 30 slices shown (2 of 3)]
[im 1/30]
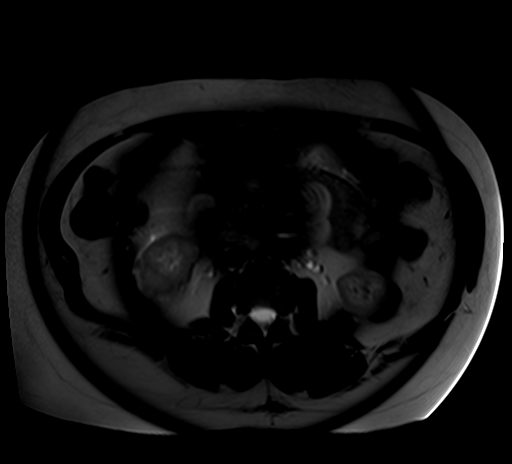

[Series 6: T2 · axial · 4.9mm · 1.37mm/px · 1 of 40 slices shown (3 of 3)]
[im 1/40]
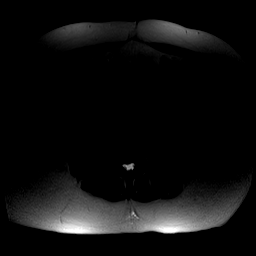

[Series 7: [id number]_500_800_p2 · axial · 6.0mm · 1.98mm/px · z∈[-32,+194]mm · 3 of 90 slices shown]
[im 1/90]
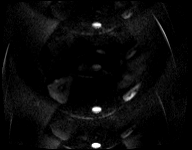
[im 45/90]
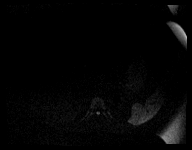
[im 90/90]
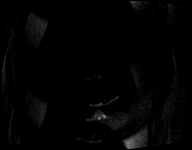

[Series 8: [id number]_adc · axial · 6.0mm · 1.98mm/px · 1 of 30 slices shown]
[im 1/30]
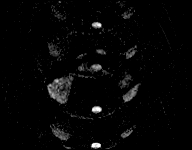

[Series 9: axial in out · axial · 6.0mm · 0.74mm/px · z∈[-69,+152]mm · 2 of 66 slices shown]
[im 1/66]
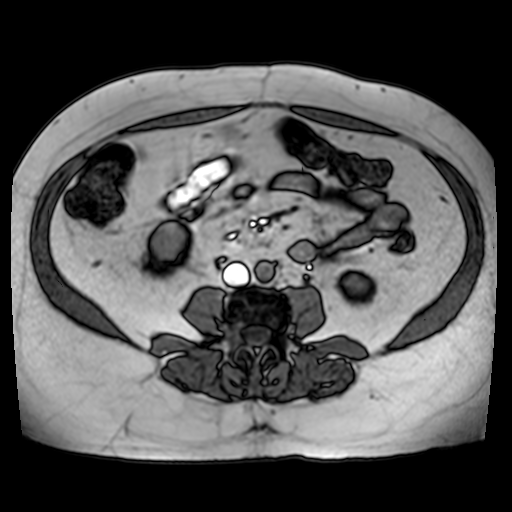
[im 66/66]
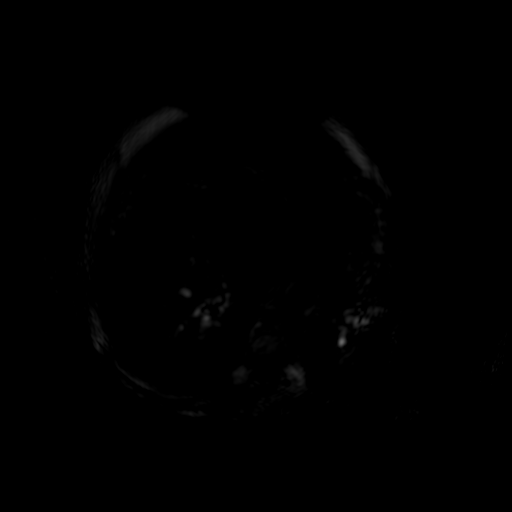

[Series 10: T1 dynamic · axial · non-contrast · 2.3mm · 0.78mm/px · z∈[-51,+146]mm · 3 of 88 slices shown]
[im 1/88]
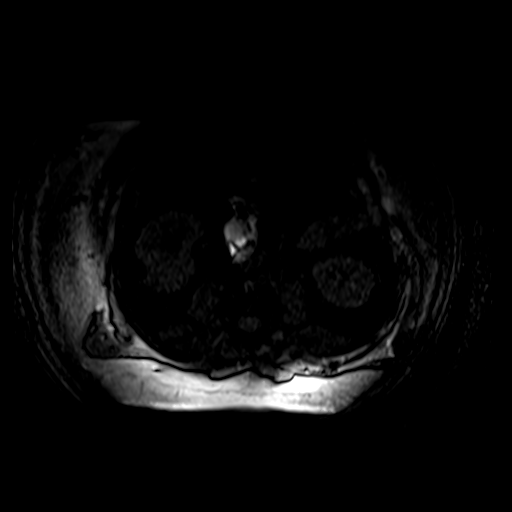
[im 44/88]
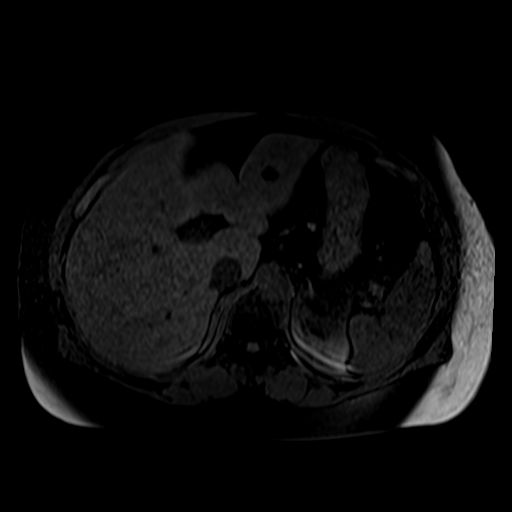
[im 88/88]
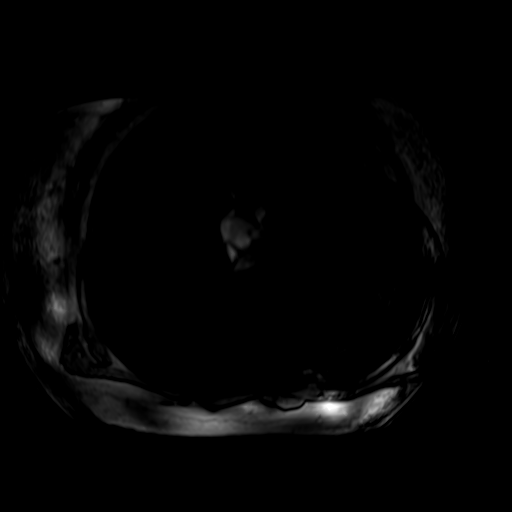

[Series 11: post 25 sec · axial · 2.3mm · 0.78mm/px · z∈[-51,+146]mm · 4 of 88 slices shown]
[im 1/88]
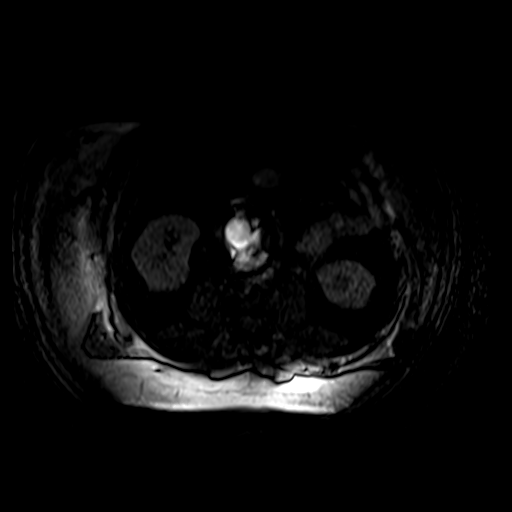
[im 30/88]
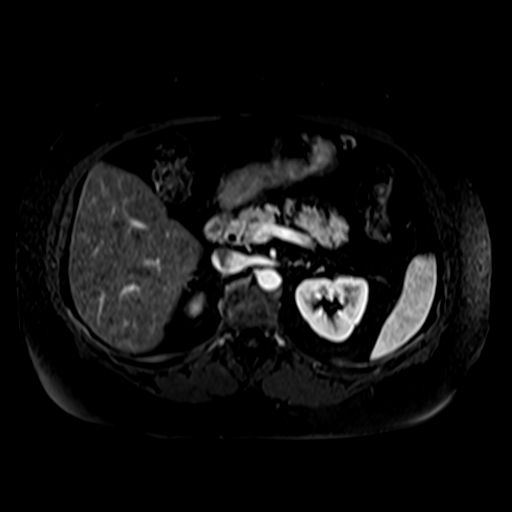
[im 59/88]
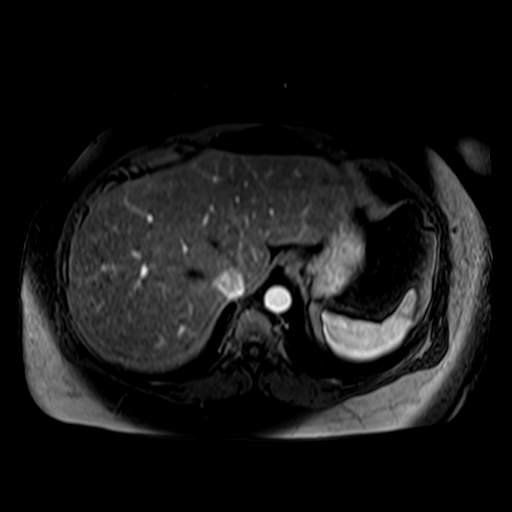
[im 88/88]
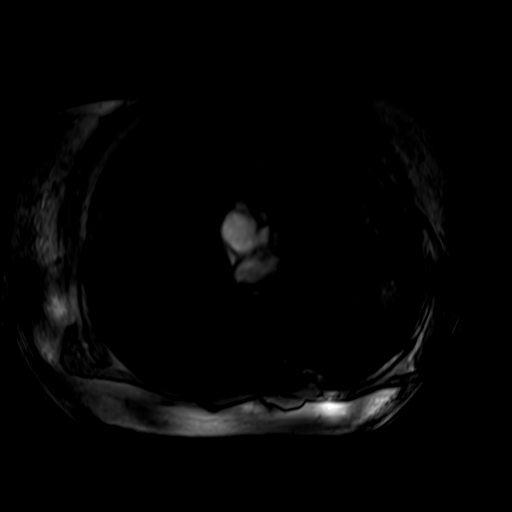

[Series 12: post 25 sec_sub · axial · 2.3mm · 0.78mm/px · z∈[-51,+146]mm · 4 of 88 slices shown]
[im 1/88]
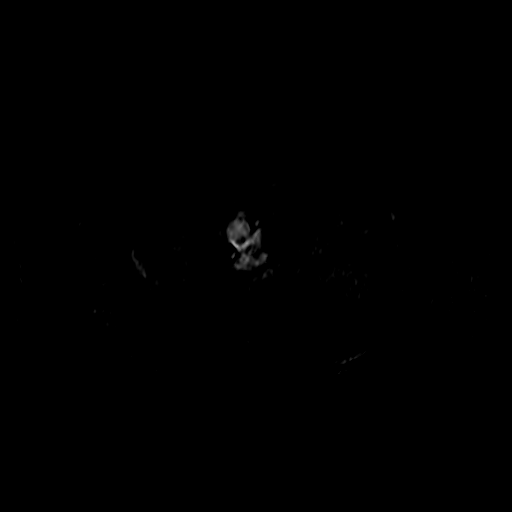
[im 30/88]
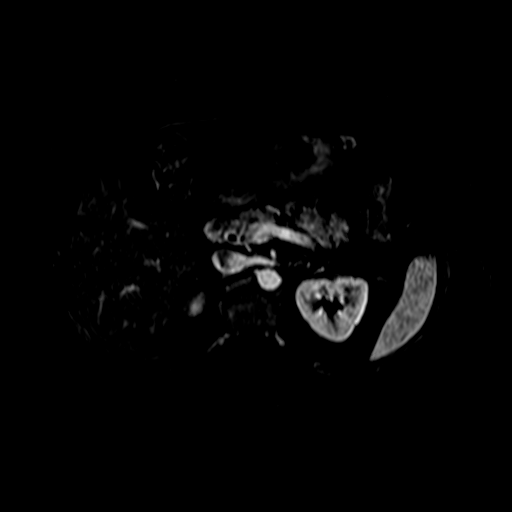
[im 59/88]
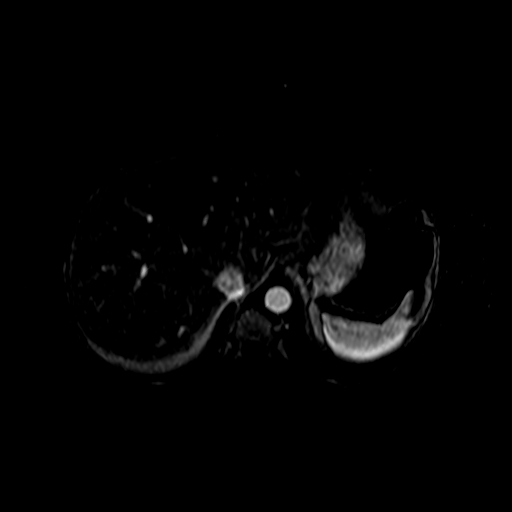
[im 88/88]
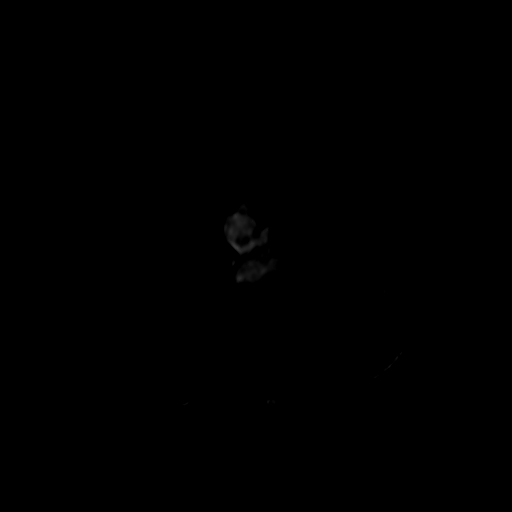

[Series 13: post 45 sec · axial · 2.3mm · 0.78mm/px · z∈[-51,+80]mm · 3 of 88 slices shown]
[im 1/88]
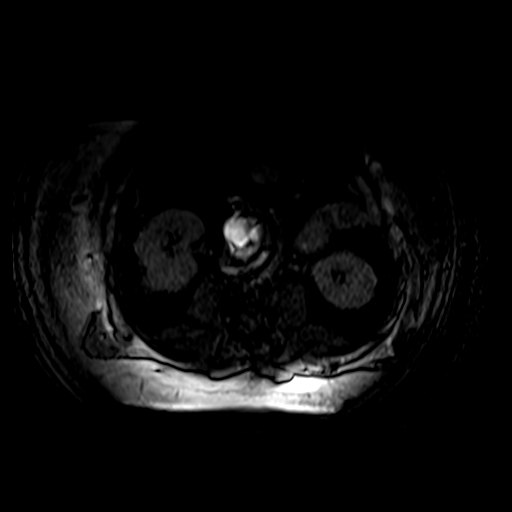
[im 30/88]
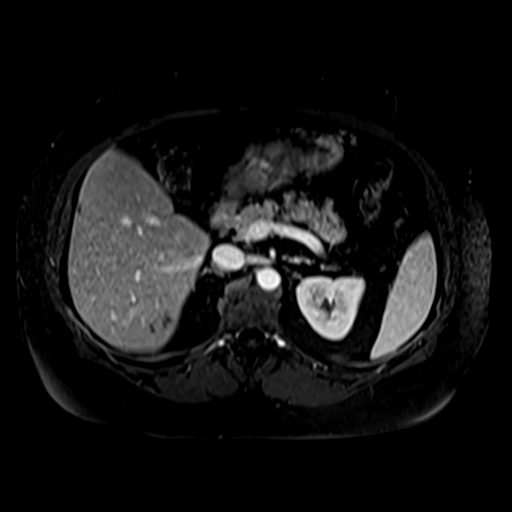
[im 59/88]
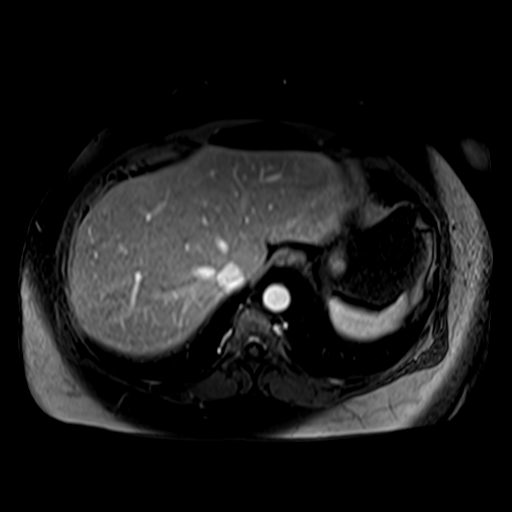

[24 of 48 positions shown; findings below may reference images not displayed]

FINDINGS: Lower chest: Unremarkable.

Hepatobiliary: Diffuse loss of signal intensity throughout the
hepatic parenchyma on out of phase dual echo images, indicative of
underlying hepatic steatosis. There are several T1 hypointense, T2
hyperintense, nonenhancing lesions scattered throughout the liver,
compatible with a combination of simple cysts and mildly complex
(mildly septated) cysts, largest of which is in segment 8 measuring
15 x 18 x 14 mm (axial image 13 of series 6 and coronal image 21 of
series 3). No other suspicious hepatic lesions are noted. No intra
or extrahepatic biliary ductal dilatation. Gallbladder is nearly
completely decompressed, but otherwise unremarkable in appearance.

Pancreas: No pancreatic mass. No pancreatic ductal dilatation. No
pancreatic or peripancreatic fluid or inflammatory changes.

Spleen:  Unremarkable.

Adrenals/Urinary Tract: Bilateral kidneys and bilateral adrenal
glands are normal in appearance. There is no hydroureteronephrosis.
Urinary bladder is normal in appearance.

Stomach/Bowel: Visualized portions are unremarkable.

Vascular/Lymphatic: No aneurysm identified in the visualized
abdominal vasculature. No lymphadenopathy noted in the abdomen.

Other: No significant volume of ascites noted in the visualized
portions of the peritoneal cavity.

Musculoskeletal: No aggressive appearing osseous lesions are noted
in the visualized portions of the skeleton.
IMPRESSION: 1. There are several lesions scattered throughout the liver,
compatible with small cysts (most are simple and some demonstrate
multiple thin internal nonenhancing septations).
2. Hepatic steatosis.

## 2019-03-09 ENCOUNTER — Encounter: Payer: Self-pay | Admitting: *Deleted

## 2019-03-10 ENCOUNTER — Other Ambulatory Visit: Payer: Self-pay

## 2019-03-10 ENCOUNTER — Encounter: Payer: Self-pay | Admitting: Family Medicine

## 2019-03-10 DIAGNOSIS — F410 Panic disorder [episodic paroxysmal anxiety] without agoraphobia: Secondary | ICD-10-CM

## 2019-03-10 MED ORDER — SERTRALINE HCL 50 MG PO TABS: 50.0000 mg | ORAL_TABLET | Freq: Every day | ORAL | 1 refills | Status: DC

## 2019-03-10 MED ORDER — PRAMIPEXOLE DIHYDROCHLORIDE 0.125 MG PO TABS: ORAL_TABLET | ORAL | 1 refills | Status: DC

## 2019-03-10 MED ORDER — MELOXICAM 15 MG PO TABS: 15.0000 mg | ORAL_TABLET | Freq: Every day | ORAL | 1 refills | Status: DC

## 2019-03-10 MED ORDER — ROSUVASTATIN CALCIUM 20 MG PO TABS: 20.0000 mg | ORAL_TABLET | Freq: Every day | ORAL | 1 refills | Status: DC

## 2019-03-10 MED ORDER — ESOMEPRAZOLE MAGNESIUM 40 MG PO PACK: 40.0000 mg | PACK | Freq: Every day | ORAL | 1 refills | Status: DC

## 2019-03-24 ENCOUNTER — Encounter: Payer: Self-pay | Admitting: Family Medicine

## 2019-03-29 ENCOUNTER — Other Ambulatory Visit: Payer: Self-pay

## 2019-03-29 MED ORDER — ESOMEPRAZOLE MAGNESIUM 40 MG PO PACK: 40.0000 mg | PACK | Freq: Every day | ORAL | 1 refills | Status: DC

## 2019-04-02 ENCOUNTER — Other Ambulatory Visit: Payer: Self-pay | Admitting: Family Medicine

## 2019-04-02 ENCOUNTER — Encounter: Payer: Self-pay | Admitting: Family Medicine

## 2019-04-21 ENCOUNTER — Encounter: Payer: Self-pay | Admitting: Family Medicine

## 2019-08-02 ENCOUNTER — Encounter: Payer: Self-pay | Admitting: Family Medicine

## 2019-08-03 ENCOUNTER — Other Ambulatory Visit: Payer: Self-pay

## 2019-08-04 ENCOUNTER — Ambulatory Visit (INDEPENDENT_AMBULATORY_CARE_PROVIDER_SITE_OTHER): Admitting: Family Medicine

## 2019-08-04 ENCOUNTER — Encounter: Payer: Self-pay | Admitting: Family Medicine

## 2019-08-04 VITALS — BP 130/78 | HR 97 | Temp 97.6°F | Wt 214.5 lb

## 2019-08-04 DIAGNOSIS — E785 Hyperlipidemia, unspecified: Secondary | ICD-10-CM | POA: Diagnosis not present

## 2019-08-04 DIAGNOSIS — K219 Gastro-esophageal reflux disease without esophagitis: Secondary | ICD-10-CM | POA: Diagnosis not present

## 2019-08-04 DIAGNOSIS — F410 Panic disorder [episodic paroxysmal anxiety] without agoraphobia: Secondary | ICD-10-CM

## 2019-08-04 DIAGNOSIS — R7303 Prediabetes: Secondary | ICD-10-CM

## 2019-08-04 DIAGNOSIS — R7309 Other abnormal glucose: Secondary | ICD-10-CM

## 2019-08-04 LAB — HEPATIC FUNCTION PANEL
ALT: 16 U/L (ref 0–35)
AST: 21 U/L (ref 0–37)
Albumin: 3.9 g/dL (ref 3.5–5.2)
Alkaline Phosphatase: 69 U/L (ref 39–117)
Bilirubin, Direct: 0.1 mg/dL (ref 0.0–0.3)
Total Bilirubin: 0.3 mg/dL (ref 0.2–1.2)
Total Protein: 6.5 g/dL (ref 6.0–8.3)

## 2019-08-04 LAB — LIPID PANEL
Cholesterol: 170 mg/dL (ref 0–200)
HDL: 58.7 mg/dL (ref >39.00)
NonHDL: 111.63
Total CHOL/HDL Ratio: 3
Triglycerides: 214 mg/dL — ABNORMAL HIGH (ref 0.0–149.0)
VLDL: 42.8 mg/dL — ABNORMAL HIGH (ref 0.0–40.0)

## 2019-08-04 LAB — HEMOGLOBIN A1C: Hgb A1c MFr Bld: 6.2 % (ref 4.6–6.5)

## 2019-08-04 LAB — LDL CHOLESTEROL, DIRECT: Direct LDL: 81 mg/dL

## 2019-08-04 MED ORDER — ROSUVASTATIN CALCIUM 20 MG PO TABS: 20.0000 mg | ORAL_TABLET | Freq: Every day | ORAL | 3 refills | Status: DC

## 2019-08-04 MED ORDER — MELOXICAM 15 MG PO TABS: 15.0000 mg | ORAL_TABLET | Freq: Every day | ORAL | 1 refills | Status: DC

## 2019-08-04 MED ORDER — SERTRALINE HCL 50 MG PO TABS: 50.0000 mg | ORAL_TABLET | Freq: Every day | ORAL | 3 refills | Status: DC

## 2019-08-04 MED ORDER — ESOMEPRAZOLE MAGNESIUM 40 MG PO PACK: PACK | ORAL | 3 refills | Status: DC

## 2019-08-04 NOTE — Progress Notes (Signed)
Subjective:     Patient ID: Tracy Strong, female   DOB: 1960/07/07, 59 y.o.   MRN: [id number]  HPI   Ziare is seen for follow-up regarding multiple medical problems.  She has history of recurrent depression and has been on sertraline for several years and feels her depression is relatively stable on that.  She has longstanding history of GERD.  She is on Nexium and needs refills.  No recent dysphagia.  She had difficulties tapering off in the past.  She has osteoarthritis involving multiple joints.  She remains on meloxicam 15 mg daily.  Denies any upper abdominal pain.  Hyperlipidemia treated with Crestor.  She is requesting lipids today.  She denies any significant myalgias.  No recent chest pains.  She has ongoing hot flashes.  Apparently her GYN increased her Premarin dose to 1.25 mg.  She still has breakthrough hot flashes and she has pending follow-up with endocrinologist in a couple of months.  She has history of prediabetes.  Her last A1c was 6.2%.  No polyuria or polydipsia.  Wt Readings from Last 3 Encounters:  08/04/19 214 lb 8 oz (97.3 kg)  05/27/18 211 lb 6.4 oz (95.9 kg)  09/03/17 214 lb 14.4 oz (97.5 kg)     Past Medical History:  Diagnosis Date  . Anxiety   . Arthritis   . Asthma   . Complication of anesthesia    took awhile waking up  . Contact lens/glasses fitting    wears glasses or contacts  . DEPRESSION 12/02/2008  . Diverticulosis   . GERD 12/02/2008  . Headache(784.0) 12/02/2008  . HYPERLIPIDEMIA 12/02/2008  . INSOMNIA 12/02/2008  . PREDIABETES 12/02/2008  . URINARY INCONTINENCE 12/02/2008   Past Surgical History:  Procedure Laterality Date  . ABDOMINAL HYSTERECTOMY  2000   partial  . COLONOSCOPY    . MOHS SURGERY  2007   nose  . MOHS SURGERY  04/20/2018  . SHOULDER ARTHROSCOPY  2010   right  . SHOULDER ARTHROSCOPY  01/07/2012   Procedure: ARTHROSCOPY SHOULDER;  Surgeon: Hessie Dibble, MD;  Location: [place];  Service:  Orthopedics;  Laterality: Left;  LEFT SHOULDER SCOPE with acromioplasty and distal clavicle resection    reports that she has never smoked. She has never used smokeless tobacco. She reports current alcohol use. She reports that she does not use drugs. family history includes Cancer in an other family member; Heart disease in an other family member; Hypertension in an other family member. Allergies  Allergen Reactions  . Latex     bruising  . Moxifloxacin     REACTION: AVALOX syncope episode, panic attacks     Review of Systems  Constitutional: Negative for fatigue and unexpected weight change.  Eyes: Negative for visual disturbance.  Respiratory: Negative for cough, chest tightness, shortness of breath and wheezing.   Cardiovascular: Negative for chest pain, palpitations and leg swelling.  Endocrine: Negative for polydipsia and polyuria.  Neurological: Negative for dizziness, seizures, syncope, weakness, light-headedness and headaches.       Objective:   Physical Exam Vitals reviewed.  Constitutional:      Appearance: Normal appearance.  Cardiovascular:     Rate and Rhythm: Normal rate and regular rhythm.  Pulmonary:     Effort: Pulmonary effort is normal.     Breath sounds: Normal breath sounds.  Musculoskeletal:     Right lower leg: No edema.     Left lower leg: No edema.  Neurological:  Mental Status: She is alert.        Assessment:     #1 hyperlipidemia.  Treated with Crestor.  Patient requesting fasting labs today.  #2 history of recurrent depression stable on sertraline.  #3 osteoarthritis involving multiple joints.  Patient treated with meloxicam and requesting refills  #4 history of prediabetes range blood sugars  #5 GERD stable on Nexium    Plan:     -Refilled several medications through [facility] including Crestor, Nexium, sertraline, Mobic -Check labs with lipid panel, basic metabolic panel, hepatic panel -Routine follow-up in 1  year and sooner as needed  Eulas Post MD [facility] [place] [facility] at [facility]

## 2019-10-07 ENCOUNTER — Encounter: Payer: Self-pay | Admitting: Family Medicine

## 2019-10-08 MED ORDER — PRAMIPEXOLE DIHYDROCHLORIDE 0.125 MG PO TABS: ORAL_TABLET | ORAL | 0 refills | Status: DC

## 2019-10-08 MED ORDER — PRAMIPEXOLE DIHYDROCHLORIDE 0.125 MG PO TABS: ORAL_TABLET | ORAL | 3 refills | Status: DC

## 2019-10-28 ENCOUNTER — Encounter: Payer: Self-pay | Admitting: Gastroenterology

## 2020-01-18 ENCOUNTER — Encounter: Payer: Self-pay | Admitting: Family Medicine

## 2020-03-08 ENCOUNTER — Other Ambulatory Visit: Payer: Self-pay | Admitting: Family Medicine

## 2020-03-08 ENCOUNTER — Encounter: Payer: Self-pay | Admitting: Family Medicine

## 2020-03-08 MED ORDER — NITROFURANTOIN MONOHYD MACRO 100 MG PO CAPS
100.0000 mg | ORAL_CAPSULE | Freq: Two times a day (BID) | ORAL | 0 refills | Status: DC
Start: 2020-03-08 — End: 2020-03-09

## 2020-03-09 ENCOUNTER — Other Ambulatory Visit: Payer: Self-pay | Admitting: Family Medicine

## 2020-03-09 NOTE — Telephone Encounter (Signed)
Keflex sent to pharmacy

## 2020-03-09 NOTE — Telephone Encounter (Signed)
Change to Keflex 500 mg po tid for 3 days.

## 2020-03-10 MED ORDER — CEPHALEXIN 500 MG PO CAPS: 500.0000 mg | ORAL_CAPSULE | Freq: Three times a day (TID) | ORAL | 0 refills | Status: DC

## 2020-03-10 NOTE — Addendum Note (Signed)
Addended by: Rodrigo Ran on: 03/10/2020 04:40 PM   Modules accepted: Orders

## 2020-03-25 HISTORY — PX: KNEE ARTHROSCOPY: SUR90

## 2020-05-03 ENCOUNTER — Encounter: Payer: Self-pay | Admitting: Gastroenterology

## 2020-07-05 ENCOUNTER — Other Ambulatory Visit: Payer: Self-pay

## 2020-07-05 ENCOUNTER — Ambulatory Visit (INDEPENDENT_AMBULATORY_CARE_PROVIDER_SITE_OTHER): Admitting: Family Medicine

## 2020-07-05 ENCOUNTER — Encounter: Payer: Self-pay | Admitting: Family Medicine

## 2020-07-05 VITALS — BP 122/80 | HR 92 | Temp 98.1°F | Wt 218.3 lb

## 2020-07-05 DIAGNOSIS — F410 Panic disorder [episodic paroxysmal anxiety] without agoraphobia: Secondary | ICD-10-CM | POA: Diagnosis not present

## 2020-07-05 DIAGNOSIS — F339 Major depressive disorder, recurrent, unspecified: Secondary | ICD-10-CM

## 2020-07-05 DIAGNOSIS — R7309 Other abnormal glucose: Secondary | ICD-10-CM

## 2020-07-05 DIAGNOSIS — J301 Allergic rhinitis due to pollen: Secondary | ICD-10-CM

## 2020-07-05 DIAGNOSIS — K219 Gastro-esophageal reflux disease without esophagitis: Secondary | ICD-10-CM

## 2020-07-05 DIAGNOSIS — E785 Hyperlipidemia, unspecified: Secondary | ICD-10-CM | POA: Diagnosis not present

## 2020-07-05 DIAGNOSIS — R232 Flushing: Secondary | ICD-10-CM

## 2020-07-05 DIAGNOSIS — J309 Allergic rhinitis, unspecified: Secondary | ICD-10-CM

## 2020-07-05 DIAGNOSIS — G2581 Restless legs syndrome: Secondary | ICD-10-CM

## 2020-07-05 MED ORDER — MONTELUKAST SODIUM 10 MG PO TABS: 10.0000 mg | ORAL_TABLET | Freq: Every day | ORAL | 3 refills | Status: AC

## 2020-07-05 MED ORDER — SERTRALINE HCL 50 MG PO TABS: 50.0000 mg | ORAL_TABLET | Freq: Every day | ORAL | 3 refills | Status: DC

## 2020-07-05 MED ORDER — ROSUVASTATIN CALCIUM 20 MG PO TABS: 20.0000 mg | ORAL_TABLET | Freq: Every day | ORAL | 3 refills | Status: DC

## 2020-07-05 MED ORDER — ESOMEPRAZOLE MAGNESIUM 40 MG PO PACK: PACK | ORAL | 3 refills | Status: DC

## 2020-07-05 MED ORDER — MONTELUKAST SODIUM 10 MG PO TABS: 10.0000 mg | ORAL_TABLET | Freq: Every day | ORAL | 3 refills | Status: DC

## 2020-07-05 MED ORDER — PRAMIPEXOLE DIHYDROCHLORIDE 0.125 MG PO TABS: ORAL_TABLET | ORAL | 3 refills | Status: DC

## 2020-07-05 NOTE — Progress Notes (Signed)
Established Patient Office Visit  Subjective:  Patient ID: Tracy Strong, female    DOB: 03-Apr-1960  Age: 60 y.o. MRN: [id number]  CC:  Chief Complaint  Patient presents with  . Medication Refill    Tramadol, had a recent knee surgery, pt also complains of having hot flashes.    HPI Tracy Strong presents for medical follow-up to discuss multiple items as below.  She is currently dealing with left knee issues.  She had recurrent left meniscal tear and is getting ready to have repeat surgery.  She is hoping to build to travel to [place] in September.  She is having hot flashes especially at night.  She was taking Premarin orally per her GYN up until knee surgery last January.  She remains off Premarin at this time.  Hot flashes are intermittent.  She has tried things like black cohosh without relief.  History of recurrent depression.  She is on sertraline 50 mg daily and that seems he working fairly well.  She is requesting refills.  She has hyperlipidemia treated with Crestor.  No significant myalgias.  Due for follow-up lipids.  She has allergic rhinitis and takes Singulair 10 mg daily for that.  Needs refills.  History of GERD treated with Nexium.  She is tried multiple times to taper off but has had breakthrough GERD symptoms not controlled with over-the-counter medications.  No dysphagia.  History of prediabetes range blood sugars.  No recent A1c.  No significant polyuria or polydipsia.  She has restless leg syndrome which is currently controlled with Mirapex.  Needs refills.  Past Medical History:  Diagnosis Date  . Anxiety   . Arthritis   . Asthma   . Complication of anesthesia    took awhile waking up  . Contact lens/glasses fitting    wears glasses or contacts  . DEPRESSION 12/02/2008  . Diverticulosis   . GERD 12/02/2008  . Headache(784.0) 12/02/2008  . HYPERLIPIDEMIA 12/02/2008  . INSOMNIA 12/02/2008  . PREDIABETES 12/02/2008  . URINARY INCONTINENCE 12/02/2008    Past  Surgical History:  Procedure Laterality Date  . COLONOSCOPY    . MOHS SURGERY  2007   nose  . MOHS SURGERY  04/20/2018  . PARTIAL HYSTERECTOMY  2000  . SHOULDER ARTHROSCOPY Right 2010  . SHOULDER ARTHROSCOPY  01/07/2012   Procedure: ARTHROSCOPY SHOULDER;  Surgeon: Hessie Dibble, MD;  Location: [facility];  Service: Orthopedics;  Laterality: Left;  LEFT SHOULDER SCOPE with acromioplasty and distal clavicle resection    Family History  Problem Relation Age of Onset  . Hypertension Other   . Cancer Other        lung, prostate  . Heart disease Other     Social History   Socioeconomic History  . Marital status: Widowed    Spouse name: Not on file  . Number of children: Not on file  . Years of education: Not on file  . Highest education level: Not on file  Occupational History  . Not on file  Tobacco Use  . Smoking status: Never Smoker  . Smokeless tobacco: Never Used  Vaping Use  . Vaping Use: Never used  Substance and Sexual Activity  . Alcohol use: Yes    Comment: occ  . Drug use: No  . Sexual activity: Not on file  Other Topics Concern  . Not on file  Social History Narrative  . Not on file   Social Determinants of Health   Financial Resource Strain:  Not on file  Food Insecurity: Not on file  Transportation Needs: Not on file  Physical Activity: Not on file  Stress: Not on file  Social Connections: Not on file  Intimate Partner Violence: Not on file    Outpatient Medications Prior to Visit  Medication Sig Dispense Refill  . budesonide-formoterol (SYMBICORT) 160-4.5 MCG/ACT inhaler Inhale 2 puffs into the lungs 2 (two) times daily as needed.    Marland Kitchen esomeprazole (NEXIUM) 40 MG packet MIX 1 PACKET THEN DRINK BY MOUTH EVERY DAY BEFORE BREAKFAST - (MIX WITH 15 ML WATER, LEAVE 2-3 MINUTES TO THICKEN, AND DRINK) 90 each 3  . estrogens, conjugated, (PREMARIN) 0.9 MG tablet Take 1.25 mg by mouth. 2 tabs in the AM and 1 tab in the PM    . meloxicam  (MOBIC) 15 MG tablet TAKE ONE TABLET BY MOUTH EVERY DAY WITH FOOD 90 tablet 1  . montelukast (SINGULAIR) 10 MG tablet Take 1 tablet (10 mg total) by mouth at bedtime. 90 tablet 3  . Multiple Vitamin (MULTIVITAMIN) tablet Take 1 tablet by mouth daily. Nutralite --- takes 2x per day    . pramipexole (MIRAPEX) 0.125 MG tablet Take 3 tabs by mouth at bedtime 90 tablet 0  . rosuvastatin (CRESTOR) 20 MG tablet Take 1 tablet (20 mg total) by mouth daily. 90 tablet 3  . sertraline (ZOLOFT) 50 MG tablet Take 1 tablet (50 mg total) by mouth daily. 90 tablet 3  . cephALEXin (KEFLEX) 500 MG capsule Take 1 capsule (500 mg total) by mouth 3 (three) times daily. 9 capsule 0  . albuterol (PROVENTIL HFA;VENTOLIN HFA) 108 (90 Base) MCG/ACT inhaler Inhale 2 puffs into the lungs every 4 (four) hours as needed for wheezing. 18 g 2  . tobramycin (TOBREX) 0.3 % ophthalmic solution Place 2 drops into the right eye every 4 (four) hours. 5 mL 0  . trimethoprim-polymyxin b (POLYTRIM) ophthalmic solution Place 2 drops into both eyes every 4 (four) hours. 10 mL 0   No facility-administered medications prior to visit.    Allergies  Allergen Reactions  . Latex     bruising  . Moxifloxacin     REACTION: AVALOX syncope episode, panic attacks    ROS Review of Systems  Constitutional: Negative for appetite change and unexpected weight change.  HENT: Negative for trouble swallowing.   Eyes: Negative for visual disturbance.  Respiratory: Negative for cough, chest tightness, shortness of breath and wheezing.   Cardiovascular: Negative for chest pain, palpitations and leg swelling.  Gastrointestinal: Negative for abdominal pain.  Genitourinary: Negative for dysuria.  Musculoskeletal: Positive for arthralgias.  Neurological: Negative for dizziness, seizures, syncope, weakness, light-headedness and headaches.      Objective:    Physical Exam Vitals reviewed.  Constitutional:      Appearance: Normal appearance.   Cardiovascular:     Rate and Rhythm: Normal rate and regular rhythm.  Pulmonary:     Effort: Pulmonary effort is normal.     Breath sounds: Normal breath sounds.  Musculoskeletal:     Right lower leg: No edema.     Left lower leg: No edema.  Neurological:     General: No focal deficit present.     Mental Status: She is alert.     BP 122/80 (BP Location: Left Arm, Patient Position: Sitting, Cuff Size: Normal)   Pulse 92   Temp 98.1 F (36.7 C) (Oral)   Wt 218 lb 4.8 oz (99 kg)   SpO2 94%   BMI 38.06 kg/m  Wt Readings from Last 3 Encounters:  07/05/20 218 lb 4.8 oz (99 kg)  08/04/19 214 lb 8 oz (97.3 kg)  05/27/18 211 lb 6.4 oz (95.9 kg)     Health Maintenance Due  Topic Date Due  . Hepatitis C Screening  Never done  . COVID-19 Vaccine (1) Never done  . HIV Screening  Never done  . COLONOSCOPY (Pts 45-43yrs Insurance coverage will need to be confirmed)  12/16/2019    There are no preventive care reminders to display for this patient.  Lab Results  Component Value Date   TSH 3.49 08/08/2015   Lab Results  Component Value Date   WBC 6.5 08/08/2015   HGB 12.2 08/08/2015   HCT 36.4 08/08/2015   MCV 87.1 08/08/2015   PLT 218.0 08/08/2015   Lab Results  Component Value Date   NA 141 11/12/2018   K 4.3 11/12/2018   CO2 30 11/12/2018   GLUCOSE 107 (H) 11/12/2018   BUN 15 11/12/2018   CREATININE 0.76 11/12/2018   BILITOT 0.3 08/04/2019   ALKPHOS 69 08/04/2019   AST 21 08/04/2019   ALT 16 08/04/2019   PROT 6.5 08/04/2019   ALBUMIN 3.9 08/04/2019   CALCIUM 9.2 11/12/2018   ANIONGAP 14 01/05/2014   GFR 77.78 11/12/2018   Lab Results  Component Value Date   CHOL 170 08/04/2019   Lab Results  Component Value Date   HDL 58.70 08/04/2019   Lab Results  Component Value Date   LDLCALC 72 11/12/2018   Lab Results  Component Value Date   TRIG 214.0 (H) 08/04/2019   Lab Results  Component Value Date   CHOLHDL 3 08/04/2019   Lab Results  Component  Value Date   HGBA1C 6.2 08/04/2019      Assessment & Plan:   #1 hyperlipidemia.  Patient treated with Crestor.  Needs follow-up labs -Check lipid and hepatic panel -Refill Crestor for 1 year  #2 allergic rhinitis.  Symptoms currently stable. -Refilled Singulair 10 mg daily for 1 year  #3 GERD stable on Nexium.  She is tried tapering without success. -Refill Nexium 40 mg once daily -Continue dietary modification  #4 history of prediabetes range blood sugars -Continue low glycemic diet and regular exercise as tolerated -Recheck A1c  #5 history of restless leg syndrome controlled on Mirapex -Refill Mirapex for 1 year  #6 postmenopausal hot flashes.  She is not any medications that would like to be causing this.  Ever since tapering off her Premarin she has had increased symptoms.  We have highly suggest that she try to stay off oral Premarin and especially in view of upcoming knee surgery.  We did discuss nonhormonal treatments.  She specifically is asking for hormonal levels to check FSH and LH.  We explained these should very likely be in the postmenopausal range.  #7 recurrent depression currently stable on sertraline 50 mg daily -Refill sertraline for 1 year  No orders of the defined types were placed in this encounter.   Follow-up: No follow-ups on file.    Carolann Littler, MD

## 2020-07-06 LAB — LIPID PANEL
Cholesterol: 158 mg/dL (ref 0–200)
HDL: 51 mg/dL (ref >39.00)
LDL Cholesterol: 72 mg/dL (ref 0–99)
NonHDL: 107.36
Total CHOL/HDL Ratio: 3
Triglycerides: 177 mg/dL — ABNORMAL HIGH (ref 0.0–149.0)
VLDL: 35.4 mg/dL (ref 0.0–40.0)

## 2020-07-06 LAB — HEPATIC FUNCTION PANEL
ALT: 78 U/L — ABNORMAL HIGH (ref 0–35)
AST: 64 U/L — ABNORMAL HIGH (ref 0–37)
Albumin: 4.2 g/dL (ref 3.5–5.2)
Alkaline Phosphatase: 84 U/L (ref 39–117)
Bilirubin, Direct: 0.1 mg/dL (ref 0.0–0.3)
Total Bilirubin: 0.6 mg/dL (ref 0.2–1.2)
Total Protein: 6.9 g/dL (ref 6.0–8.3)

## 2020-07-06 LAB — FOLLICLE STIMULATING HORMONE: FSH: 68.6 m[IU]/mL

## 2020-07-06 LAB — LUTEINIZING HORMONE: LH: 43.73 m[IU]/mL

## 2020-07-06 LAB — HEMOGLOBIN A1C: Hgb A1c MFr Bld: 6.4 % (ref 4.6–6.5)

## 2020-07-10 ENCOUNTER — Encounter: Payer: Self-pay | Admitting: Family Medicine

## 2020-09-14 ENCOUNTER — Encounter: Payer: Self-pay | Admitting: Family Medicine

## 2020-10-02 ENCOUNTER — Other Ambulatory Visit: Payer: Self-pay

## 2020-10-03 ENCOUNTER — Ambulatory Visit (INDEPENDENT_AMBULATORY_CARE_PROVIDER_SITE_OTHER): Admitting: Family Medicine

## 2020-10-03 ENCOUNTER — Encounter: Payer: Self-pay | Admitting: Family Medicine

## 2020-10-03 VITALS — BP 150/78 | HR 97 | Temp 98.3°F | Wt 213.7 lb

## 2020-10-03 DIAGNOSIS — R7401 Elevation of levels of liver transaminase levels: Secondary | ICD-10-CM

## 2020-10-03 LAB — HEPATIC FUNCTION PANEL
ALT: 41 U/L — ABNORMAL HIGH (ref 0–35)
AST: 37 U/L (ref 0–37)
Albumin: 4.4 g/dL (ref 3.5–5.2)
Alkaline Phosphatase: 87 U/L (ref 39–117)
Bilirubin, Direct: 0.1 mg/dL (ref 0.0–0.3)
Total Bilirubin: 0.7 mg/dL (ref 0.2–1.2)
Total Protein: 6.7 g/dL (ref 6.0–8.3)

## 2020-10-03 MED ORDER — MELOXICAM 15 MG PO TABS: 15.0000 mg | ORAL_TABLET | Freq: Every day | ORAL | 1 refills | Status: DC

## 2020-10-03 NOTE — Progress Notes (Signed)
Established Patient Office Visit  Subjective:  Patient ID: Tracy Strong, female    DOB: [birth year]  Age: 60 y.o. MRN: [id number]  CC:  Chief Complaint  Patient presents with   Follow-up    Follow up on elevated liver enzymes.     HPI [name] presents for follow-up regarding elevated liver transaminases.  She was here for visit back in April.  Labs significant for AST of 64 and ALT 78.  Rarely uses alcohol.  Previous MRI scan of the abdomen showed evidence for fatty liver changes.  Patient subsequently had some labs done through [facility] 2 weeks later at the end of April and liver enzymes had improved though not completely normal.  Her ALT was 44 with AST of 33.  No recent abdominal pain.  Appetite and weight stable.  No family history of liver disorders such as hemochromatosis.  No recent fever.  Has been taking lots of Tylenol recently for some joint problems but not exceeding recommended amounts.  She does take some meloxicam for arthritis and requesting refill   Past Medical History:  Diagnosis Date   Anxiety    Arthritis    Asthma    Complication of anesthesia    took awhile waking up   Contact lens/glasses fitting    wears glasses or contacts   DEPRESSION 12/02/2008   Diverticulosis    GERD 12/02/2008   Headache(784.0) 12/02/2008   HYPERLIPIDEMIA 12/02/2008   INSOMNIA 12/02/2008   PREDIABETES 12/02/2008   URINARY INCONTINENCE 12/02/2008    Past Surgical History:  Procedure Laterality Date   COLONOSCOPY     MOHS SURGERY  2007   nose   MOHS SURGERY  04/20/2018   PARTIAL HYSTERECTOMY  2000   SHOULDER ARTHROSCOPY Right 2010   SHOULDER ARTHROSCOPY  01/07/2012   Procedure: ARTHROSCOPY SHOULDER;  Surgeon: Hessie Dibble, MD;  Location: [facility];  Service: Orthopedics;  Laterality: Left;  LEFT SHOULDER SCOPE with acromioplasty and distal clavicle resection    Family History  Problem Relation Age of Onset   Hypertension Other    Cancer Other         lung, prostate   Heart disease Other     Social History   Socioeconomic History   Marital status: Widowed    Spouse name: Not on file   Number of children: Not on file   Years of education: Not on file   Highest education level: Not on file  Occupational History   Not on file  Tobacco Use   Smoking status: Never   Smokeless tobacco: Never  Vaping Use   Vaping Use: Never used  Substance and Sexual Activity   Alcohol use: Yes    Comment: occ   Drug use: No   Sexual activity: Not on file  Other Topics Concern   Not on file  Social History Narrative   Not on file   Social Determinants of Health   Financial Resource Strain: Not on file  Food Insecurity: Not on file  Transportation Needs: Not on file  Physical Activity: Not on file  Stress: Not on file  Social Connections: Not on file  Intimate Partner Violence: Not on file    Outpatient Medications Prior to Visit  Medication Sig Dispense Refill   budesonide-formoterol (SYMBICORT) 160-4.5 MCG/ACT inhaler Inhale 2 puffs into the lungs 2 (two) times daily as needed.     esomeprazole (NEXIUM) 40 MG packet MIX 1 PACKET THEN DRINK BY MOUTH EVERY DAY  BEFORE BREAKFAST - (MIX WITH 15 ML WATER, LEAVE 2-3 MINUTES TO THICKEN, AND DRINK) 90 each 3   estrogens, conjugated, (PREMARIN) 0.9 MG tablet Take 1.25 mg by mouth. 2 tabs in the AM and 1 tab in the PM     montelukast (SINGULAIR) 10 MG tablet Take 1 tablet (10 mg total) by mouth at bedtime. 90 tablet 3   Multiple Vitamin (MULTIVITAMIN) tablet Take 1 tablet by mouth daily. Nutralite --- takes 2x per day     pramipexole (MIRAPEX) 0.125 MG tablet Take 3 tabs by mouth at bedtime 270 tablet 3   rosuvastatin (CRESTOR) 20 MG tablet Take 1 tablet (20 mg total) by mouth daily. 90 tablet 3   sertraline (ZOLOFT) 50 MG tablet Take 1 tablet (50 mg total) by mouth daily. 90 tablet 3   meloxicam (MOBIC) 15 MG tablet TAKE ONE TABLET BY MOUTH EVERY DAY WITH FOOD 90 tablet 1   albuterol  (PROVENTIL HFA;VENTOLIN HFA) 108 (90 Base) MCG/ACT inhaler Inhale 2 puffs into the lungs every 4 (four) hours as needed for wheezing. 18 g 2   No facility-administered medications prior to visit.    Allergies  Allergen Reactions   Latex     bruising   Moxifloxacin     REACTION: AVALOX syncope episode, panic attacks    ROS Review of Systems  Constitutional:  Negative for appetite change and unexpected weight change.  Respiratory:  Negative for shortness of breath.   Gastrointestinal:  Negative for abdominal pain.     Objective:    Physical Exam Vitals reviewed.  Constitutional:      Appearance: Normal appearance.  Cardiovascular:     Rate and Rhythm: Normal rate and regular rhythm.  Pulmonary:     Effort: Pulmonary effort is normal.     Breath sounds: Normal breath sounds.  Abdominal:     Palpations: Abdomen is soft. There is no mass.     Tenderness: There is no abdominal tenderness.  Neurological:     Mental Status: She is alert.    BP (!) 150/78 (BP Location: Left Arm, Patient Position: Sitting, Cuff Size: Normal)   Pulse 97   Temp 98.3 F (36.8 C) (Oral)   Wt 213 lb 11.2 oz (96.9 kg)   SpO2 95%   BMI 37.26 kg/m  Wt Readings from Last 3 Encounters:  10/03/20 213 lb 11.2 oz (96.9 kg)  07/05/20 218 lb 4.8 oz (99 kg)  08/04/19 214 lb 8 oz (97.3 kg)     Health Maintenance Due  Topic Date Due   COVID-19 Vaccine (1) Never done   HIV Screening  Never done   Hepatitis C Screening  Never done   Zoster Vaccines- Shingrix (1 of 2) Never done   COLONOSCOPY (Pts 45-59yrs Insurance coverage will need to be confirmed)  12/16/2019   PAP SMEAR-Modifier  11/19/2020    There are no preventive care reminders to display for this patient.  Lab Results  Component Value Date   TSH 3.49 08/08/2015   Lab Results  Component Value Date   WBC 6.5 08/08/2015   HGB 12.2 08/08/2015   HCT 36.4 08/08/2015   MCV 87.1 08/08/2015   PLT 218.0 08/08/2015   Lab Results   Component Value Date   NA 141 11/12/2018   K 4.3 11/12/2018   CO2 30 11/12/2018   GLUCOSE 107 (H) 11/12/2018   BUN 15 11/12/2018   CREATININE 0.76 11/12/2018   BILITOT 0.6 07/05/2020   ALKPHOS 84 07/05/2020   AST  64 (H) 07/05/2020   ALT 78 (H) 07/05/2020   PROT 6.9 07/05/2020   ALBUMIN 4.2 07/05/2020   CALCIUM 9.2 11/12/2018   ANIONGAP 14 01/05/2014   GFR 77.78 11/12/2018   Lab Results  Component Value Date   CHOL 158 07/05/2020   Lab Results  Component Value Date   HDL 51.00 07/05/2020   Lab Results  Component Value Date   LDLCALC 72 07/05/2020   Lab Results  Component Value Date   TRIG 177.0 (H) 07/05/2020   Lab Results  Component Value Date   CHOLHDL 3 07/05/2020   Lab Results  Component Value Date   HGBA1C 6.4 07/05/2020      Assessment & Plan:   Problem List Items Addressed This Visit   None Visit Diagnoses     Elevated liver transaminase level    -  Primary   Relevant Orders   Hepatic function panel     Recent mild elevation of liver transaminases.  Suspect related to fatty liver changes.  -Stressed importance of weight loss and management. -Avoid regular use of alcohol -Recheck liver panel today.  If enzymes still out consider further evaluation including hepatitis studies and test for ruling out hemochromatosis-though low risk  Meds ordered this encounter  Medications   meloxicam (MOBIC) 15 MG tablet    Sig: Take 1 tablet (15 mg total) by mouth daily. with food    Dispense:  90 tablet    Refill:  1    Follow-up: No follow-ups on file.    Carolann Littler, MD

## 2020-10-26 ENCOUNTER — Encounter: Payer: Self-pay | Admitting: Family Medicine

## 2020-10-27 MED ORDER — LORAZEPAM 0.5 MG PO TABS: 0.5000 mg | ORAL_TABLET | Freq: Three times a day (TID) | ORAL | 0 refills | Status: DC | PRN

## 2020-10-27 NOTE — Telephone Encounter (Signed)
I sent in very limited number of lorazepam to take 1 every 8 hours as needed for severe anxiety symptoms

## 2021-02-21 ENCOUNTER — Ambulatory Visit (INDEPENDENT_AMBULATORY_CARE_PROVIDER_SITE_OTHER): Admitting: Family Medicine

## 2021-02-21 ENCOUNTER — Encounter: Payer: Self-pay | Admitting: Family Medicine

## 2021-02-21 VITALS — BP 120/82 | HR 97 | Temp 98.0°F | Resp 18 | Wt 216.0 lb

## 2021-02-21 DIAGNOSIS — Z01818 Encounter for other preprocedural examination: Secondary | ICD-10-CM | POA: Diagnosis not present

## 2021-02-21 DIAGNOSIS — R051 Acute cough: Secondary | ICD-10-CM | POA: Diagnosis not present

## 2021-02-21 LAB — HEMOGLOBIN A1C: Hgb A1c MFr Bld: 6.3 % (ref 4.6–6.5)

## 2021-02-21 LAB — BASIC METABOLIC PANEL
BUN: 18 mg/dL (ref 6–23)
CO2: 29 mEq/L (ref 19–32)
Calcium: 9.8 mg/dL (ref 8.4–10.5)
Chloride: 105 mEq/L (ref 96–112)
Creatinine, Ser: 0.88 mg/dL (ref 0.40–1.20)
GFR: 70.79 mL/min (ref >60.00)
Glucose, Bld: 101 mg/dL — ABNORMAL HIGH (ref 70–99)
Potassium: 4.3 mEq/L (ref 3.5–5.1)
Sodium: 142 mEq/L (ref 135–145)

## 2021-02-21 LAB — CBC WITH DIFFERENTIAL/PLATELET
Basophils Absolute: 0 10*3/uL (ref 0.0–0.1)
Basophils Relative: 0.6 % (ref 0.0–3.0)
Eosinophils Absolute: 0.1 10*3/uL (ref 0.0–0.7)
Eosinophils Relative: 1.9 % (ref 0.0–5.0)
HCT: 38.7 % (ref 36.0–46.0)
Hemoglobin: 12.7 g/dL (ref 12.0–15.0)
Lymphocytes Relative: 14.4 % (ref 12.0–46.0)
Lymphs Abs: 0.8 10*3/uL (ref 0.7–4.0)
MCHC: 32.9 g/dL (ref 30.0–36.0)
MCV: 85.3 fl (ref 78.0–100.0)
Monocytes Absolute: 0.5 10*3/uL (ref 0.1–1.0)
Monocytes Relative: 8.4 % (ref 3.0–12.0)
Neutro Abs: 4 10*3/uL (ref 1.4–7.7)
Neutrophils Relative %: 74.7 % (ref 43.0–77.0)
Platelets: 196 10*3/uL (ref 150.0–400.0)
RBC: 4.54 Mil/uL (ref 3.87–5.11)
RDW: 14.3 % (ref 11.5–15.5)
WBC: 5.4 10*3/uL (ref 4.0–10.5)

## 2021-02-21 LAB — POC COVID19 BINAXNOW: SARS Coronavirus 2 Ag: NEGATIVE

## 2021-02-21 LAB — PROTIME-INR
INR: 1 ratio (ref 0.8–1.0)
Prothrombin Time: 11 s (ref 9.6–13.1)

## 2021-02-21 MED ORDER — HYDROCODONE BIT-HOMATROP MBR 5-1.5 MG/5ML PO SOLN: 5.0000 mL | Freq: Four times a day (QID) | ORAL | 0 refills | Status: DC | PRN

## 2021-02-21 NOTE — Progress Notes (Signed)
Established Patient Office Visit  Subjective:  Patient ID: Tracy Strong, female    DOB: [birth year]  Age: 60 y.o. MRN: [id number]  CC:  Chief Complaint  Patient presents with   Medical Clearance    Surgery clearance , pt would like to have bloodwork     HPI Tracy Strong presents for medical clearance for upcoming left total knee replacement December 22.  She does relate recent respiratory illness.  She has had some cough now for several days.  Started last weekend.  No fever.  She is requesting COVID testing.  Denies any dyspnea.  She does have history of prediabetes.  Last A1c 6.4%.  Needs follow-up labs including CBC, basic metabolic panel, E3M, INR.  She had a couple knee surgeries in the past year with meniscus surgery and did well with anesthesia.  No recent chest pains.  No family history of premature CAD. No history of cerebrovascular disease.  Past Medical History:  Diagnosis Date   Anxiety    Arthritis    Asthma    Complication of anesthesia    took awhile waking up   Contact lens/glasses fitting    wears glasses or contacts   DEPRESSION 12/02/2008   Diverticulosis    GERD 12/02/2008   Headache(784.0) 12/02/2008   HYPERLIPIDEMIA 12/02/2008   INSOMNIA 12/02/2008   PREDIABETES 12/02/2008   URINARY INCONTINENCE 12/02/2008    Past Surgical History:  Procedure Laterality Date   COLONOSCOPY     MOHS SURGERY  2007   nose   MOHS SURGERY  04/20/2018   PARTIAL HYSTERECTOMY  2000   SHOULDER ARTHROSCOPY Right 2010   SHOULDER ARTHROSCOPY  01/07/2012   Procedure: ARTHROSCOPY SHOULDER;  Surgeon: Hessie Dibble, MD;  Location: [facility];  Service: Orthopedics;  Laterality: Left;  LEFT SHOULDER SCOPE with acromioplasty and distal clavicle resection    Family History  Problem Relation Age of Onset   Hypertension Other    Cancer Other        lung, prostate   Heart disease Other     Social History   Socioeconomic History   Marital status: Widowed     Spouse name: Not on file   Number of children: Not on file   Years of education: Not on file   Highest education level: Not on file  Occupational History   Not on file  Tobacco Use   Smoking status: Never   Smokeless tobacco: Never  Vaping Use   Vaping Use: Never used  Substance and Sexual Activity   Alcohol use: Yes    Comment: occ   Drug use: No   Sexual activity: Not on file  Other Topics Concern   Not on file  Social History Narrative   Not on file   Social Determinants of Health   Financial Resource Strain: Not on file  Food Insecurity: Not on file  Transportation Needs: Not on file  Physical Activity: Not on file  Stress: Not on file  Social Connections: Not on file  Intimate Partner Violence: Not on file    Outpatient Medications Prior to Visit  Medication Sig Dispense Refill   budesonide-formoterol (SYMBICORT) 160-4.5 MCG/ACT inhaler Inhale 2 puffs into the lungs 2 (two) times daily as needed.     esomeprazole (NEXIUM) 40 MG packet MIX 1 PACKET THEN DRINK BY MOUTH EVERY DAY BEFORE BREAKFAST - (MIX WITH 15 ML WATER, LEAVE 2-3 MINUTES TO THICKEN, AND DRINK) 90 each 3   LORazepam (ATIVAN) 0.5 MG  tablet Take 1 tablet (0.5 mg total) by mouth every 8 (eight) hours as needed for anxiety. 20 tablet 0   meloxicam (MOBIC) 15 MG tablet Take 1 tablet (15 mg total) by mouth daily. with food 90 tablet 1   montelukast (SINGULAIR) 10 MG tablet Take 1 tablet (10 mg total) by mouth at bedtime. 90 tablet 3   Multiple Vitamin (MULTIVITAMIN) tablet Take 1 tablet by mouth daily. Nutralite --- takes 2x per day     pramipexole (MIRAPEX) 0.125 MG tablet Take 3 tabs by mouth at bedtime 270 tablet 3   rosuvastatin (CRESTOR) 20 MG tablet Take 1 tablet (20 mg total) by mouth daily. 90 tablet 3   sertraline (ZOLOFT) 50 MG tablet Take 1 tablet (50 mg total) by mouth daily. 90 tablet 3   albuterol (PROVENTIL HFA;VENTOLIN HFA) 108 (90 Base) MCG/ACT inhaler Inhale 2 puffs into the lungs every 4  (four) hours as needed for wheezing. 18 g 2   CELEBREX 200 MG capsule Take 200 mg by mouth 2 (two) times daily as needed.     estrogens, conjugated, (PREMARIN) 0.9 MG tablet Take 1.25 mg by mouth. 2 tabs in the AM and 1 tab in the PM (Patient not taking: Reported on 02/21/2021)     No facility-administered medications prior to visit.    Allergies  Allergen Reactions   Clindamycin/Lincomycin Other (See Comments) and Nausea And Vomiting   Latex     bruising   Moxifloxacin     REACTION: AVALOX syncope episode, panic attacks    ROS Review of Systems  Constitutional:  Negative for chills and fever.  Respiratory:  Positive for cough. Negative for shortness of breath and wheezing.   Cardiovascular:  Negative for chest pain, palpitations and leg swelling.  Gastrointestinal:  Negative for abdominal pain.  Genitourinary:  Negative for dysuria.  Neurological:  Negative for syncope.     Objective:    Physical Exam Constitutional:      Appearance: She is well-developed.  Eyes:     Pupils: Pupils are equal, round, and reactive to light.  Neck:     Thyroid: No thyromegaly.     Vascular: No JVD.     Comments: No carotid bruits Cardiovascular:     Rate and Rhythm: Normal rate and regular rhythm.     Heart sounds:    No gallop.  Pulmonary:     Effort: Pulmonary effort is normal. No respiratory distress.     Breath sounds: Normal breath sounds. No wheezing or rales.  Abdominal:     Palpations: Abdomen is soft.     Tenderness: There is no abdominal tenderness.  Musculoskeletal:     Cervical back: Neck supple.     Right lower leg: No edema.     Left lower leg: No edema.  Neurological:     Mental Status: She is alert.    BP 120/82 (BP Location: Left Arm, Patient Position: Sitting, Cuff Size: Normal)   Pulse 97   Temp 98 F (36.7 C) (Oral)   Resp 18   Wt 216 lb (98 kg)   SpO2 96%   BMI 37.66 kg/m  Wt Readings from Last 3 Encounters:  02/21/21 216 lb (98 kg)  10/03/20 213 lb  11.2 oz (96.9 kg)  07/05/20 218 lb 4.8 oz (99 kg)     Health Maintenance Due  Topic Date Due   COVID-19 Vaccine (1) Never done   HIV Screening  Never done   Hepatitis C Screening  Never done  Zoster Vaccines- Shingrix (1 of 2) Never done   COLONOSCOPY (Pts 45-23yrs Insurance coverage will need to be confirmed)  12/16/2019   INFLUENZA VACCINE  10/23/2020   PAP SMEAR-Modifier  11/19/2020   MAMMOGRAM  11/24/2020    There are no preventive care reminders to display for this patient.  Lab Results  Component Value Date   TSH 3.49 08/08/2015   Lab Results  Component Value Date   WBC 6.5 08/08/2015   HGB 12.2 08/08/2015   HCT 36.4 08/08/2015   MCV 87.1 08/08/2015   PLT 218.0 08/08/2015   Lab Results  Component Value Date   NA 141 11/12/2018   K 4.3 11/12/2018   CO2 30 11/12/2018   GLUCOSE 107 (H) 11/12/2018   BUN 15 11/12/2018   CREATININE 0.76 11/12/2018   BILITOT 0.7 10/03/2020   ALKPHOS 87 10/03/2020   AST 37 10/03/2020   ALT 41 (H) 10/03/2020   PROT 6.7 10/03/2020   ALBUMIN 4.4 10/03/2020   CALCIUM 9.2 11/12/2018   ANIONGAP 14 01/05/2014   GFR 77.78 11/12/2018   Lab Results  Component Value Date   CHOL 158 07/05/2020   Lab Results  Component Value Date   HDL 51.00 07/05/2020   Lab Results  Component Value Date   LDLCALC 72 07/05/2020   Lab Results  Component Value Date   TRIG 177.0 (H) 07/05/2020   Lab Results  Component Value Date   CHOLHDL 3 07/05/2020   Lab Results  Component Value Date   HGBA1C 6.4 07/05/2020      Assessment & Plan:   Problem List Items Addressed This Visit   None Visit Diagnoses     Preoperative clearance    -  Primary   Relevant Orders   Basic metabolic panel   CBC with Differential/Platelet   Protime-INR   Hemoglobin A1c   Acute cough       Relevant Orders   POC COVID-19     Patient has no known contraindications for surgery for left total knee replacement.  We will obtain screening labs which surgeon  requests including CBC, basic metabolic panel, C1K, INR.  These will be forwarded to surgeon along with require paperwork.  We did not see any clear indication for EKG at this time.  She has had no recent cardiovascular symptoms other than recent cough  -Check rapid COVID screen per patient request.  -We sent in limited Hycodan cough syrup 1 teaspoon nightly for severe cough.  She has tried Tessalon in the past without much relief  Over 30 minutes spent with patient assessing above issues  Meds ordered this encounter  Medications   HYDROcodone bit-homatropine (HYCODAN) 5-1.5 MG/5ML syrup    Sig: Take 5 mLs by mouth every 6 (six) hours as needed for cough.    Dispense:  120 mL    Refill:  0    Follow-up: No follow-ups on file.    Carolann Littler, MD

## 2021-03-15 HISTORY — PX: KNEE SURGERY: SHX244

## 2021-04-10 ENCOUNTER — Other Ambulatory Visit: Payer: Self-pay | Admitting: Family Medicine

## 2021-07-02 ENCOUNTER — Other Ambulatory Visit: Payer: Self-pay | Admitting: Family Medicine

## 2021-07-02 ENCOUNTER — Encounter: Payer: Self-pay | Admitting: Family Medicine

## 2021-07-02 ENCOUNTER — Other Ambulatory Visit: Payer: Self-pay

## 2021-07-02 DIAGNOSIS — F410 Panic disorder [episodic paroxysmal anxiety] without agoraphobia: Secondary | ICD-10-CM

## 2021-07-02 MED ORDER — MELOXICAM 15 MG PO TABS: 15.0000 mg | ORAL_TABLET | Freq: Every day | ORAL | 0 refills | Status: DC

## 2021-07-02 MED ORDER — SERTRALINE HCL 50 MG PO TABS: 50.0000 mg | ORAL_TABLET | Freq: Every day | ORAL | 0 refills | Status: DC

## 2021-07-02 MED ORDER — ROSUVASTATIN CALCIUM 20 MG PO TABS: 20.0000 mg | ORAL_TABLET | Freq: Every day | ORAL | 0 refills | Status: DC

## 2021-07-02 NOTE — Telephone Encounter (Signed)
Dr. Elease Hashimoto patient. ? ?Refill for Meloxicam was sent to pharmacy on 07/02/2020 ? ?Last refill by historical provider - 02/21/21 ?Last OV- 02/21/21 ? ?Can this patient receive a refill? ?

## 2021-07-03 ENCOUNTER — Other Ambulatory Visit (HOSPITAL_COMMUNITY): Payer: Self-pay

## 2021-07-03 ENCOUNTER — Other Ambulatory Visit: Payer: Self-pay

## 2021-07-03 MED ORDER — CELEBREX 200 MG PO CAPS: 200.0000 mg | ORAL_CAPSULE | Freq: Two times a day (BID) | ORAL | 0 refills | Status: DC | PRN

## 2021-07-03 MED ORDER — CELEBREX 200 MG PO CAPS
200.0000 mg | ORAL_CAPSULE | Freq: Two times a day (BID) | ORAL | 0 refills | Status: DC | PRN
  Filled 2021-07-03: qty 60, 30d supply, fill #0

## 2021-07-03 NOTE — Telephone Encounter (Signed)
I will leave 90 day refills to Dr. Elease Hashimoto  ?

## 2021-07-12 ENCOUNTER — Other Ambulatory Visit: Payer: Self-pay

## 2021-07-12 DIAGNOSIS — G2581 Restless legs syndrome: Secondary | ICD-10-CM

## 2021-07-12 DIAGNOSIS — K219 Gastro-esophageal reflux disease without esophagitis: Secondary | ICD-10-CM

## 2021-07-12 MED ORDER — MELOXICAM 15 MG PO TABS: 15.0000 mg | ORAL_TABLET | Freq: Every day | ORAL | 0 refills | Status: DC

## 2021-07-12 MED ORDER — CELEBREX 200 MG PO CAPS: 200.0000 mg | ORAL_CAPSULE | Freq: Two times a day (BID) | ORAL | 2 refills | Status: DC | PRN

## 2021-07-12 MED ORDER — ESOMEPRAZOLE MAGNESIUM 40 MG PO PACK: PACK | ORAL | 1 refills | Status: DC

## 2021-07-18 ENCOUNTER — Other Ambulatory Visit: Payer: Self-pay

## 2021-07-18 DIAGNOSIS — G2581 Restless legs syndrome: Secondary | ICD-10-CM

## 2021-07-18 MED ORDER — MELOXICAM 15 MG PO TABS: 15.0000 mg | ORAL_TABLET | Freq: Every day | ORAL | 0 refills | Status: DC

## 2021-08-08 ENCOUNTER — Encounter: Payer: Self-pay | Admitting: Family Medicine

## 2021-08-23 HISTORY — PX: LEFT HEART CATH: SHX5946

## 2021-09-11 ENCOUNTER — Telehealth: Payer: Self-pay

## 2021-09-11 NOTE — Telephone Encounter (Signed)
Transition Care Management Unsuccessful Follow-up Telephone Call  Date of discharge and from where:  [facility] 09-10-21 Dx: chest pain   Attempts:  1st Attempt  Reason for unsuccessful TCM follow-up call:  Left voice message  Transition Care Management Follow-up Telephone Call Date of discharge and from where: [facility] 09-10-21 Dx: chest pain  How have you been since you were released from the hospital? Doing ok  BP is still elevated  Any questions or concerns? Yes  Items Reviewed: Did the pt receive and understand the discharge instructions provided? Yes  Medications obtained and verified? Yes  Other? No  Any new allergies since your discharge? No  Dietary orders reviewed? Yes Do you have support at home? Yes   Home Care and Equipment/Supplies: Were home health services ordered? no If so, what is the name of the agency? na  Has the agency set up a time to come to the patient's home? not applicable Were any new equipment or medical supplies ordered?  No What is the name of the medical supply agency? na Were you able to get the supplies/equipment? not applicable Do you have any questions related to the use of the equipment or supplies? No  Functional Questionnaire: (I = Independent and D = Dependent) ADLs: I  Bathing/Dressing- I  Meal Prep- I  Eating- I  Maintaining continence- I  Transferring/Ambulation- I  Managing Meds- I  Follow up appointments reviewed:  PCP Hospital f/u appt confirmed? Yes  Scheduled to see Dr Elease Hashimoto on 09-12-21 @ [facility] f/u appt confirmed? No . Are transportation arrangements needed? No  If their condition worsens, is the pt aware to call PCP or go to the Emergency Dept.? Yes Was the patient provided with contact information for the PCP's office or ED? Yes Was to pt encouraged to call back with questions or concerns? Yes

## 2021-09-12 ENCOUNTER — Encounter: Payer: Self-pay | Admitting: Family Medicine

## 2021-09-12 ENCOUNTER — Ambulatory Visit (INDEPENDENT_AMBULATORY_CARE_PROVIDER_SITE_OTHER): Admitting: Family Medicine

## 2021-09-12 VITALS — BP 136/70 | HR 75 | Temp 97.8°F | Ht 63.5 in | Wt 213.5 lb

## 2021-09-12 DIAGNOSIS — I10 Essential (primary) hypertension: Secondary | ICD-10-CM

## 2021-09-12 DIAGNOSIS — R7303 Prediabetes: Secondary | ICD-10-CM | POA: Insufficient documentation

## 2021-09-12 DIAGNOSIS — R0789 Other chest pain: Secondary | ICD-10-CM | POA: Diagnosis not present

## 2021-09-12 DIAGNOSIS — E785 Hyperlipidemia, unspecified: Secondary | ICD-10-CM

## 2021-09-12 MED ORDER — LOSARTAN POTASSIUM 25 MG PO TABS: 25.0000 mg | ORAL_TABLET | Freq: Every day | ORAL | 2 refills | Status: DC

## 2021-09-12 NOTE — Patient Instructions (Signed)
Start the Losartan and continue the Toprol XL   Try to gradually increase your walking  Try to lose some weight.    Consider once daily Pepcid 20 mg   Set up follow up in about 3- 4 weeks.

## 2021-09-12 NOTE — Progress Notes (Signed)
Established Patient Office Visit  Subjective   Patient ID: Tracy Strong, female    DOB: 1960/10/20  Age: 61 y.o. MRN: [id number]  Chief Complaint  Patient presents with   Hospitalization Follow-up    HPI   Tracy Strong is seen following recent hospitalization for chest pressure.  She states last Thursday she had episode of transient chest pressure which was substernal lasting about 15 or 20 minutes.  She had a little bit of lightheadedness.  She then had another episode driving Saturday which lasted 20 minutes which was very similar.  She has some nausea and sweats.  No radiation.  She then woke up Sunday night with similar symptoms and at that point went promptly to the ER for further evaluation and was admitted.  She was ruled out for MI.  EKG no acute changes.  She does have longstanding history of GERD and takes Nexium for that.  She had EGD 2014.  Underwent cardiac cath which showed no significant obstructive coronary disease.  She was started on baby aspirin 81 mg and Crestor was increased from 20-40 if she was started on Toprol-XL 25 mg daily.  She had prediabetes range blood sugar with A1c 6.3%.  At baseline she takes Nexium 40 mg daily and was told to supplement with Pepcid as needed.  Denies any recent pain with swallowing or dysphagia.  Appetite and weight stable.  Past Medical History:  Diagnosis Date   Anxiety    Arthritis    Asthma    Complication of anesthesia    took awhile waking up   Contact lens/glasses fitting    wears glasses or contacts   DEPRESSION 12/02/2008   Diverticulosis    GERD 12/02/2008   Headache(784.0) 12/02/2008   HYPERLIPIDEMIA 12/02/2008   INSOMNIA 12/02/2008   PREDIABETES 12/02/2008   URINARY INCONTINENCE 12/02/2008   Past Surgical History:  Procedure Laterality Date   COLONOSCOPY     MOHS SURGERY  2007   nose   MOHS SURGERY  04/20/2018   PARTIAL HYSTERECTOMY  2000   SHOULDER ARTHROSCOPY Right 2010   SHOULDER ARTHROSCOPY  01/07/2012    Procedure: ARTHROSCOPY SHOULDER;  Surgeon: Hessie Dibble, MD;  Location: [facility];  Service: Orthopedics;  Laterality: Left;  LEFT SHOULDER SCOPE with acromioplasty and distal clavicle resection    reports that she has never smoked. She has never used smokeless tobacco. She reports current alcohol use. She reports that she does not use drugs. family history includes Cancer in an other family member; Heart disease in an other family member; Hypertension in an other family member. Allergies  Allergen Reactions   Clindamycin/Lincomycin Other (See Comments) and Nausea And Vomiting   Latex     bruising   Moxifloxacin     REACTION: AVALOX syncope episode, panic attacks    Review of Systems  Constitutional:  Negative for chills, fever and malaise/fatigue.  Eyes:  Negative for blurred vision.  Respiratory:  Negative for shortness of breath.   Cardiovascular:        See HPI.  No chest pain since discharge.  Gastrointestinal:  Negative for abdominal pain, nausea and vomiting.  Genitourinary:  Negative for dysuria.  Neurological:  Negative for dizziness, weakness and headaches.      Objective:     BP 136/70 (BP Location: Left Arm, Patient Position: Sitting, Cuff Size: Large)   Pulse 75   Temp 97.8 F (36.6 C) (Oral)   Ht 5' 3.5" (1.613 m)   Wt 213 lb  8 oz (96.8 kg)   SpO2 97%   BMI 37.23 kg/m  BP Readings from Last 3 Encounters:  09/12/21 136/70  02/21/21 120/82  10/03/20 (!) 150/78   Wt Readings from Last 3 Encounters:  09/12/21 213 lb 8 oz (96.8 kg)  02/21/21 216 lb (98 kg)  10/03/20 213 lb 11.2 oz (96.9 kg)      Physical Exam Constitutional:      Appearance: She is well-developed.  Eyes:     Pupils: Pupils are equal, round, and reactive to light.  Neck:     Thyroid: No thyromegaly.     Vascular: No JVD.  Cardiovascular:     Rate and Rhythm: Normal rate and regular rhythm.     Heart sounds:     No gallop.  Pulmonary:     Effort: Pulmonary  effort is normal. No respiratory distress.     Breath sounds: Normal breath sounds. No wheezing or rales.  Musculoskeletal:     Cervical back: Neck supple.     Right lower leg: No edema.     Left lower leg: No edema.  Neurological:     Mental Status: She is alert.      No results found for any visits on 09/12/21.  Last CBC Lab Results  Component Value Date   WBC 5.4 02/21/2021   HGB 12.7 02/21/2021   HCT 38.7 02/21/2021   MCV 85.3 02/21/2021   MCH 29.7 01/05/2014   RDW 14.3 02/21/2021   PLT 196.0 20/35/5974   Last metabolic panel Lab Results  Component Value Date   GLUCOSE 101 (H) 02/21/2021   NA 142 02/21/2021   K 4.3 02/21/2021   CL 105 02/21/2021   CO2 29 02/21/2021   BUN 18 02/21/2021   CREATININE 0.88 02/21/2021   GFRNONAA 82 (L) 01/05/2014   CALCIUM 9.8 02/21/2021   PROT 6.7 10/03/2020   ALBUMIN 4.4 10/03/2020   BILITOT 0.7 10/03/2020   ALKPHOS 87 10/03/2020   AST 37 10/03/2020   ALT 41 (H) 10/03/2020   ANIONGAP 14 01/05/2014   Last lipids Lab Results  Component Value Date   CHOL 158 07/05/2020   HDL 51.00 07/05/2020   LDLCALC 72 07/05/2020   LDLDIRECT 81.0 08/04/2019   TRIG 177.0 (H) 07/05/2020   CHOLHDL 3 07/05/2020   Last hemoglobin A1c Lab Results  Component Value Date   HGBA1C 6.3 02/21/2021   Last thyroid functions Lab Results  Component Value Date   TSH 3.49 08/08/2015      The 10-year ASCVD risk score (Arnett DK, et al., 2019) is: 3.7%    Assessment & Plan:   #1 recent chest pain with work-up as above including catheterization with no significant coronary disease.  Possibly GI related.  Currently stable at this time.  #2 history of GERD.  Patient currently on Nexium 40 mg daily.  Possibly having some breakthrough symptoms.  Supplement with Pepcid 20 mg once daily as needed.  If symptoms not resolving over the next week with addition of Pepcid consider referral to GI for further evaluation  #3 hypertension.  Multiple elevated  readings including several readings at home elevated even after addition of Toprol-XL 25 mg daily. -Discussed lifestyle modification.  Strongly recommend she lose some weight -Handout on DASH diet given -Try to establish more consistent walking or other aerobic exercise -Add losartan 25 mg once daily -Set up 3-week follow-up to reassess  #4 prediabetes range blood sugar recent A1c 6.3% -Discussed lifestyle modification.  Recommend repeat A1c within  at least 6 months  #5 dyslipidemia.  Crestor recently increased from 20 to 40 mg. -Recheck lipids at follow-up  Return in about 4 weeks (around 10/10/2021).    Carolann Littler, MD

## 2021-10-03 ENCOUNTER — Encounter: Payer: Self-pay | Admitting: Family Medicine

## 2021-10-03 ENCOUNTER — Ambulatory Visit (INDEPENDENT_AMBULATORY_CARE_PROVIDER_SITE_OTHER): Admitting: Family Medicine

## 2021-10-03 VITALS — BP 110/62 | HR 67 | Temp 97.6°F | Ht 63.5 in | Wt 215.6 lb

## 2021-10-03 DIAGNOSIS — I10 Essential (primary) hypertension: Secondary | ICD-10-CM

## 2021-10-03 DIAGNOSIS — K219 Gastro-esophageal reflux disease without esophagitis: Secondary | ICD-10-CM

## 2021-10-03 MED ORDER — PRAMIPEXOLE DIHYDROCHLORIDE 0.125 MG PO TABS: ORAL_TABLET | ORAL | 3 refills | Status: DC

## 2021-10-03 MED ORDER — FAMOTIDINE 40 MG PO TABS: 40.0000 mg | ORAL_TABLET | Freq: Every day | ORAL | 3 refills | Status: AC

## 2021-10-03 MED ORDER — METOPROLOL SUCCINATE ER 25 MG PO TB24: 25.0000 mg | ORAL_TABLET | Freq: Every day | ORAL | 3 refills | Status: DC

## 2021-10-03 MED ORDER — LOSARTAN POTASSIUM 25 MG PO TABS: 25.0000 mg | ORAL_TABLET | Freq: Every day | ORAL | 3 refills | Status: DC

## 2021-10-03 MED ORDER — METOPROLOL SUCCINATE ER 25 MG PO TB24
25.0000 mg | ORAL_TABLET | Freq: Every day | ORAL | 0 refills | Status: DC
Start: 2021-10-03 — End: 2021-11-16

## 2021-10-03 NOTE — Progress Notes (Unsigned)
Established Patient Office Visit  Subjective   Patient ID: Tracy Strong, female    DOB: 08-12-1960  Age: 61 y.o. MRN: [id number]  Chief Complaint  Patient presents with   Follow-up    HPI  {History (Optional):23778} Here for follow-up regarding hypertension.  We recently initiated losartan 25 mg daily.  She has had good control by home readings.  These are improved some.  No dizziness.  Tolerating with no side effects.  She also remains on low-dose metoprolol succinate.  Needs refills.  She has tried to reduce sodium intake.  Longstanding history of GERD.  She takes Nexium 40 mg daily.  Recently added Pepcid 40 mg daily and even with addition of Pepcid still having some breakthrough symptoms.  No dysphagia.  No odynophagia.  Appetite and weight stable.  Past Medical History:  Diagnosis Date   Anxiety    Arthritis    Asthma    Complication of anesthesia    took awhile waking up   Contact lens/glasses fitting    wears glasses or contacts   DEPRESSION 12/02/2008   Diverticulosis    GERD 12/02/2008   Headache(784.0) 12/02/2008   HYPERLIPIDEMIA 12/02/2008   INSOMNIA 12/02/2008   PREDIABETES 12/02/2008   URINARY INCONTINENCE 12/02/2008   Past Surgical History:  Procedure Laterality Date   COLONOSCOPY     MOHS SURGERY  2007   nose   MOHS SURGERY  04/20/2018   PARTIAL HYSTERECTOMY  2000   SHOULDER ARTHROSCOPY Right 2010   SHOULDER ARTHROSCOPY  01/07/2012   Procedure: ARTHROSCOPY SHOULDER;  Surgeon: Hessie Dibble, MD;  Location: [facility];  Service: Orthopedics;  Laterality: Left;  LEFT SHOULDER SCOPE with acromioplasty and distal clavicle resection    reports that she has never smoked. She has never used smokeless tobacco. She reports current alcohol use. She reports that she does not use drugs. family history includes Cancer in an other family member; Heart disease in an other family member; Hypertension in an other family member. Allergies  Allergen  Reactions   Clindamycin/Lincomycin Other (See Comments) and Nausea And Vomiting   Latex     bruising   Moxifloxacin     REACTION: AVALOX syncope episode, panic attacks    Review of Systems  Constitutional:  Negative for malaise/fatigue.  Eyes:  Negative for blurred vision.  Respiratory:  Negative for shortness of breath.   Cardiovascular:  Negative for chest pain.  Neurological:  Negative for dizziness, weakness and headaches.      Objective:     BP 110/62 (BP Location: Left Arm, Cuff Size: Large)   Pulse 67   Temp 97.6 F (36.4 C) (Oral)   Ht 5' 3.5" (1.613 m)   Wt 215 lb 9.6 oz (97.8 kg)   SpO2 98%   BMI 37.59 kg/m  {Vitals History (Optional):23777}  Physical Exam Constitutional:      Appearance: She is well-developed.  Eyes:     Pupils: Pupils are equal, round, and reactive to light.  Neck:     Thyroid: No thyromegaly.     Vascular: No JVD.  Cardiovascular:     Rate and Rhythm: Normal rate and regular rhythm.     Heart sounds:     No gallop.  Pulmonary:     Effort: Pulmonary effort is normal. No respiratory distress.     Breath sounds: Normal breath sounds. No wheezing or rales.  Musculoskeletal:     Cervical back: Neck supple.  Neurological:     Mental Status:  She is alert.      No results found for any visits on 10/03/21.  {Labs (Optional):23779}  The 10-year ASCVD risk score (Arnett DK, et al., 2019) is: 3.3%    Assessment & Plan:   #1 hypertension improved with recent addition of losartan 25 mg daily.  Continue weight loss efforts.  Try to establish more consistent aerobic exercise.  #2 GERD.  Patient still having almost daily symptoms in spite of Nexium 40 mg daily and Pepcid 40 mg daily.  Handout on GERD given.  Discussed dietary factors.  Consider GI follow-up and she plans to set this up  No follow-ups on file.    Carolann Littler, MD

## 2021-11-16 ENCOUNTER — Other Ambulatory Visit: Payer: Self-pay | Admitting: Family Medicine

## 2021-11-26 ENCOUNTER — Encounter: Payer: Self-pay | Admitting: Family Medicine

## 2021-11-27 MED ORDER — LORAZEPAM 0.5 MG PO TABS: 0.5000 mg | ORAL_TABLET | Freq: Four times a day (QID) | ORAL | 0 refills | Status: DC | PRN

## 2021-11-27 NOTE — Telephone Encounter (Signed)
I am sending in limited supply of lorazepam-but very important for her to know that she cannot drive if she takes this as may cause some sedation

## 2022-01-17 ENCOUNTER — Encounter: Payer: Self-pay | Admitting: Family Medicine

## 2022-01-17 ENCOUNTER — Other Ambulatory Visit: Payer: Self-pay | Admitting: Family Medicine

## 2022-01-17 DIAGNOSIS — G2581 Restless legs syndrome: Secondary | ICD-10-CM

## 2022-01-17 DIAGNOSIS — F410 Panic disorder [episodic paroxysmal anxiety] without agoraphobia: Secondary | ICD-10-CM

## 2022-01-17 MED ORDER — MELOXICAM 15 MG PO TABS: 15.0000 mg | ORAL_TABLET | Freq: Every day | ORAL | 0 refills | Status: DC

## 2022-01-17 MED ORDER — ROSUVASTATIN CALCIUM 40 MG PO TABS: 40.0000 mg | ORAL_TABLET | Freq: Every day | ORAL | 0 refills | Status: DC

## 2022-01-17 MED ORDER — SERTRALINE HCL 50 MG PO TABS: 50.0000 mg | ORAL_TABLET | Freq: Every day | ORAL | 0 refills | Status: DC

## 2022-01-21 ENCOUNTER — Other Ambulatory Visit: Payer: Self-pay | Admitting: Family Medicine

## 2022-01-21 DIAGNOSIS — G2581 Restless legs syndrome: Secondary | ICD-10-CM

## 2022-01-21 DIAGNOSIS — F410 Panic disorder [episodic paroxysmal anxiety] without agoraphobia: Secondary | ICD-10-CM

## 2022-01-21 NOTE — Telephone Encounter (Signed)
Last Ov-10/03/21 Last refill-11-27-21--15 tabs, 0 refills  No future Ov scheduled.

## 2022-01-22 MED ORDER — LORAZEPAM 0.5 MG PO TABS: 0.5000 mg | ORAL_TABLET | Freq: Four times a day (QID) | ORAL | 0 refills | Status: DC | PRN

## 2022-01-22 NOTE — Telephone Encounter (Signed)
Refilled once to local pharmacy.  Avoid regular use.  Eulas Post MD [facility]

## 2022-01-24 ENCOUNTER — Other Ambulatory Visit: Payer: Self-pay | Admitting: Family Medicine

## 2022-01-24 DIAGNOSIS — F410 Panic disorder [episodic paroxysmal anxiety] without agoraphobia: Secondary | ICD-10-CM

## 2022-01-25 MED ORDER — SERTRALINE HCL 50 MG PO TABS: 50.0000 mg | ORAL_TABLET | Freq: Every day | ORAL | 0 refills | Status: DC

## 2022-01-28 ENCOUNTER — Telehealth: Payer: Self-pay | Admitting: Gastroenterology

## 2022-01-28 ENCOUNTER — Encounter: Payer: Self-pay | Admitting: Gastroenterology

## 2022-01-28 NOTE — Telephone Encounter (Signed)
HI Dr. Fuller Plan,    Patient called states she would like to schedule her colonoscopy procedure. Patient has recall in [place] since 2021 but did not call to schedule. Wondering if she could proceed on scheduling the procedure or she needs an office visit first.  Please call to advise.    Thank you

## 2022-01-28 NOTE — Telephone Encounter (Signed)
OK for direct schedule colonoscopy in [place].

## 2022-01-28 NOTE — Telephone Encounter (Signed)
Patient is scheduled for colon on 12-13 and Pre-visit on 11-21.

## 2022-02-12 ENCOUNTER — Ambulatory Visit (AMBULATORY_SURGERY_CENTER): Payer: Self-pay

## 2022-02-12 VITALS — Ht 63.0 in | Wt 221.0 lb

## 2022-02-12 DIAGNOSIS — Z1211 Encounter for screening for malignant neoplasm of colon: Secondary | ICD-10-CM

## 2022-02-12 MED ORDER — PEG 3350-KCL-NA BICARB-NACL 420 G PO SOLR: 4000.0000 mL | Freq: Once | ORAL | 0 refills | Status: AC

## 2022-02-12 NOTE — Progress Notes (Signed)

## 2022-02-17 ENCOUNTER — Other Ambulatory Visit: Payer: Self-pay | Admitting: Family Medicine

## 2022-02-17 DIAGNOSIS — F410 Panic disorder [episodic paroxysmal anxiety] without agoraphobia: Secondary | ICD-10-CM

## 2022-02-27 ENCOUNTER — Telehealth: Payer: Self-pay | Admitting: *Deleted

## 2022-02-27 ENCOUNTER — Other Ambulatory Visit: Payer: Self-pay | Admitting: Family Medicine

## 2022-02-27 DIAGNOSIS — K219 Gastro-esophageal reflux disease without esophagitis: Secondary | ICD-10-CM

## 2022-02-27 MED ORDER — ONDANSETRON HCL 4 MG PO TABS: 4.0000 mg | ORAL_TABLET | Freq: Three times a day (TID) | ORAL | 0 refills | Status: DC | PRN

## 2022-02-27 NOTE — Telephone Encounter (Signed)
Patient call in to report vomiting after taking Ducolax and before starting bowel prep.  Zofran 4 mg PO sent to pharmacy.

## 2022-02-28 ENCOUNTER — Encounter: Payer: Self-pay | Admitting: Gastroenterology

## 2022-02-28 ENCOUNTER — Ambulatory Visit (AMBULATORY_SURGERY_CENTER): Admitting: Gastroenterology

## 2022-02-28 VITALS — BP 145/65 | HR 54 | Temp 96.2°F | Resp 16 | Ht 63.0 in | Wt 221.0 lb

## 2022-02-28 DIAGNOSIS — Z1211 Encounter for screening for malignant neoplasm of colon: Secondary | ICD-10-CM

## 2022-02-28 MED ORDER — SODIUM CHLORIDE 0.9 % IV SOLN: 500.0000 mL | Freq: Once | INTRAVENOUS | Status: DC

## 2022-02-28 NOTE — Progress Notes (Signed)
Vss nad trans to pacu °

## 2022-02-28 NOTE — Progress Notes (Signed)
Pt's states no medical or surgical changes since previsit or office visit. 

## 2022-02-28 NOTE — Op Note (Signed)
[facility] Patient Name: Chanay Nugent Procedure Date: 02/28/2022 3:34 PM MRN: [id number] Endoscopist: Ladene Artist , MD, [id number] Age: 61 Referring MD:  Date of Birth: 01-Nov-1960 Gender: Female Account #: [id number] Procedure:                Colonoscopy Indications:              Screening for colorectal malignant neoplasm Medicines:                Monitored Anesthesia Care Procedure:                Pre-Anesthesia Assessment:                           - Prior to the procedure, a History and Physical                            was performed, and patient medications and                            allergies were reviewed. The patient's tolerance of                            previous anesthesia was also reviewed. The risks                            and benefits of the procedure and the sedation                            options and risks were discussed with the patient.                            All questions were answered, and informed consent                            was obtained. Prior Anticoagulants: The patient has                            taken no anticoagulant or antiplatelet agents. ASA                            Grade Assessment: III - A patient with severe                            systemic disease. After reviewing the risks and                            benefits, the patient was deemed in satisfactory                            condition to undergo the procedure.                           After obtaining informed consent, the colonoscope  was passed under direct vision. Throughout the                            procedure, the patient's blood pressure, pulse, and                            oxygen saturations were monitored continuously. The                            CF [id number] #[id number] was introduced through the anus                            and advanced to the the cecum, identified by                            appendiceal  orifice and ileocecal valve. The                            ileocecal valve, appendiceal orifice, and rectum                            were photographed. The quality of the bowel                            preparation was good. The colonoscopy was performed                            without difficulty. The patient tolerated the                            procedure well. Scope In: 3:39:03 PM Scope Out: 3:50:56 PM Scope Withdrawal Time: 0 hours 9 minutes 34 seconds  Total Procedure Duration: 0 hours 11 minutes 53 seconds  Findings:                 The perianal and digital rectal examinations were                            normal.                           Multiple medium-mouthed diverticula were found in                            the left colon. There was no evidence of                            diverticular bleeding.                           The exam was otherwise without abnormality on                            direct and retroflexion views. Complications:            No immediate complications. Estimated blood loss:  None. Estimated Blood Loss:     Estimated blood loss: none. Impression:               - Mild diverticulosis in the left colon.                           - The examination was otherwise normal on direct                            and retroflexion views.                           - No specimens collected. Recommendation:           - Repeat colonoscopy in 10 years for screening                            purposes.                           - Patient has a contact number available for                            emergencies. The signs and symptoms of potential                            delayed complications were discussed with the                            patient. Return to normal activities tomorrow.                            Written discharge instructions were provided to the                            patient.                           -  High fiber diet.                           - Continue present medications. Ladene Artist, MD 02/28/2022 3:54:30 PM This report has been signed electronically.

## 2022-02-28 NOTE — Patient Instructions (Signed)
-   Repeat colonoscopy in 10 years for screening purposes. - Patient has a contact number available for emergencies. The signs and symptoms of potential delayed complications were discussed with the patient. Return to normal activities tomorrow. Written discharge instructions were provided to the patient. - High fiber diet. Handout provided  - Continue present medications. -Handout on diverticulosis provided   YOU HAD AN ENDOSCOPIC PROCEDURE TODAY AT [facility]:   Refer to the procedure report that was given to you for any specific questions about what was found during the examination.  If the procedure report does not answer your questions, please call your gastroenterologist to clarify.  If you requested that your care partner not be given the details of your procedure findings, then the procedure report has been included in a sealed envelope for you to review at your convenience later.  YOU SHOULD EXPECT: Some feelings of bloating in the abdomen. Passage of more gas than usual.  Walking can help get rid of the air that was put into your GI tract during the procedure and reduce the bloating. If you had a lower endoscopy (such as a colonoscopy or flexible sigmoidoscopy) you may notice spotting of blood in your stool or on the toilet paper. If you underwent a bowel prep for your procedure, you may not have a normal bowel movement for a few days.  Please Note:  You might notice some irritation and congestion in your nose or some drainage.  This is from the oxygen used during your procedure.  There is no need for concern and it should clear up in a day or so.  SYMPTOMS TO REPORT IMMEDIATELY:  Following lower endoscopy (colonoscopy or flexible sigmoidoscopy):  Excessive amounts of blood in the stool  Significant tenderness or worsening of abdominal pains  Swelling of the abdomen that is new, acute  Fever of 100F or higher  For urgent or emergent issues, a gastroenterologist can be  reached at any hour by calling [phone]. Do not use MyChart messaging for urgent concerns.    DIET:  We do recommend a small meal at first, but then you may proceed to your regular diet.  Drink plenty of fluids but you should avoid alcoholic beverages for 24 hours.  ACTIVITY:  You should plan to take it easy for the rest of today and you should NOT DRIVE or use heavy machinery until tomorrow (because of the sedation medicines used during the test).    FOLLOW UP: Our staff will call the number listed on your records the next business day following your procedure.  We will call around 7:15- 8:00 am to check on you and address any questions or concerns that you may have regarding the information given to you following your procedure. If we do not reach you, we will leave a message.     If any biopsies were taken you will be contacted by phone or by letter within the next 1-3 weeks.  Please call us at [phone] if you have not heard about the biopsies in 3 weeks.    SIGNATURES/CONFIDENTIALITY: You and/or your care partner have signed paperwork which will be entered into your electronic medical record.  These signatures attest to the fact that that the information above on your After Visit Summary has been reviewed and is understood.  Full responsibility of the confidentiality of this discharge information lies with you and/or your care-partner.

## 2022-02-28 NOTE — Progress Notes (Signed)
History & Physical  Primary Care Physician:  Eulas Post, MD Primary Gastroenterologist: Lucio Edward, MD  CHIEF COMPLAINT:  CRC screening   HPI: Tracy Strong is a 61 y.o. female average risk CRC screening for colonoscopy.    Past Medical History:  Diagnosis Date   Allergy    seasonal   Anxiety    Arthritis    Asthma    Cancer ([place] [facility]) 03/2018   skin cancer   Complication of anesthesia    took awhile waking up   Contact lens/glasses fitting    wears glasses or contacts   DEPRESSION 12/02/2008   Diverticulosis    GERD 12/02/2008   Headache(784.0) 12/02/2008   HYPERLIPIDEMIA 12/02/2008   Hypertension    INSOMNIA 12/02/2008   PREDIABETES 12/02/2008   URINARY INCONTINENCE 12/02/2008    Past Surgical History:  Procedure Laterality Date   COLONOSCOPY  12/15/2009   normal   KNEE ARTHROSCOPY Left 2022   2 x prior to replacement surgery   KNEE SURGERY Left 03/15/2021   knee replacement   LEFT HEART CATH  08/2021   normal   MOHS SURGERY  2007   nose   MOHS SURGERY  04/20/2018   PARTIAL HYSTERECTOMY  2000   SHOULDER ARTHROSCOPY Right 2010   SHOULDER ARTHROSCOPY  01/07/2012   Procedure: ARTHROSCOPY SHOULDER;  Surgeon: Hessie Dibble, MD;  Location: [facility];  Service: Orthopedics;  Laterality: Left;  LEFT SHOULDER SCOPE with acromioplasty and distal clavicle resection    Prior to Admission medications   Medication Sig Start Date End Date Taking? Authorizing Provider  albuterol (PROVENTIL HFA;VENTOLIN HFA) 108 (90 Base) MCG/ACT inhaler Inhale 2 puffs into the lungs every 4 (four) hours as needed for wheezing. 06/08/15 02/12/22  Burchette, Alinda Sierras, MD  albuterol (VENTOLIN HFA) 108 (90 Base) MCG/ACT inhaler Inhale into the lungs.    [provider]  aspirin EC 81 MG tablet Take 81 mg by mouth daily. Swallow whole.    [provider]  budesonide-formoterol (SYMBICORT) 160-4.5 MCG/ACT inhaler Inhale 2 puffs into the lungs 2 (two)  times daily as needed.    [provider]  esomeprazole (NEXIUM) 40 MG packet MIX 1 PACKET THEN DRINK BY MOUTH EVERY DAY BEFORE BREAKFAST (MIX WITH  15 ML WATER, LEAVE 2-3 MINUTES TO THICKEN, AND DRINK) 02/27/22   Burchette, Alinda Sierras, MD  estrogens, conjugated, (PREMARIN) 0.9 MG tablet Take 1.25 mg by mouth. 2 tabs in the AM and 1 tab in the PM    [provider]  famotidine (PEPCID) 40 MG tablet Take 1 tablet (40 mg total) by mouth daily. Patient taking differently: Take 40 mg by mouth daily. Takes as needed 10/03/21   Burchette, Alinda Sierras, MD  LORazepam (ATIVAN) 0.5 MG tablet Take 1 tablet (0.5 mg total) by mouth every 6 (six) hours as needed for anxiety. 01/22/22   Burchette, Alinda Sierras, MD  losartan (COZAAR) 25 MG tablet Take 1 tablet (25 mg total) by mouth daily. 10/03/21   Burchette, Alinda Sierras, MD  meloxicam (MOBIC) 15 MG tablet Take 1 tablet (15 mg total) by mouth daily. with food 01/17/22   Burchette, Alinda Sierras, MD  metoprolol succinate (TOPROL-XL) 25 MG 24 hr tablet TAKE 1 TABLET (25 MG TOTAL) BY MOUTH DAILY. 11/16/21   Burchette, Alinda Sierras, MD  mometasone (NASONEX) 50 MCG/ACT nasal spray Place into the nose.    [provider]  montelukast (SINGULAIR) 10 MG tablet Take 1 tablet (10 mg total) by  mouth at bedtime. 07/05/20   Burchette, Alinda Sierras, MD  Multiple Vitamin (MULTIVITAMIN) tablet Take 1 tablet by mouth daily. Nutralite --- takes 2x per day    [provider]  NASACORT ALLERGY 24HR 55 MCG/ACT AERO nasal inhaler 1 spray daily. 11/14/21   [provider]  Omega-3 Fatty Acids (FISH OIL) 1200 MG CAPS     [provider]  ondansetron (ZOFRAN) 4 MG tablet Take 1 tablet (4 mg total) by mouth every 8 (eight) hours as needed for nausea or vomiting. 02/27/22   Ladene Artist, MD  pramipexole (MIRAPEX) 0.125 MG tablet Take 3 tabs by mouth at bedtime 10/03/21   Burchette, Alinda Sierras, MD  rosuvastatin (CRESTOR) 40 MG tablet Take 1 tablet (40 mg total) by mouth daily.  01/17/22   Burchette, Alinda Sierras, MD  sertraline (ZOLOFT) 50 MG tablet TAKE 1 TABLET BY MOUTH EVERY DAY 02/19/22   Burchette, Alinda Sierras, MD  ZYRTEC ALLERGY 10 MG tablet Take 10 mg by mouth daily as needed. 11/14/21   [provider]    Current Outpatient Medications  Medication Sig Dispense Refill   albuterol (PROVENTIL HFA;VENTOLIN HFA) 108 (90 Base) MCG/ACT inhaler Inhale 2 puffs into the lungs every 4 (four) hours as needed for wheezing. 18 g 2   albuterol (VENTOLIN HFA) 108 (90 Base) MCG/ACT inhaler Inhale into the lungs.     aspirin EC 81 MG tablet Take 81 mg by mouth daily. Swallow whole.     budesonide-formoterol (SYMBICORT) 160-4.5 MCG/ACT inhaler Inhale 2 puffs into the lungs 2 (two) times daily as needed.     esomeprazole (NEXIUM) 40 MG packet MIX 1 PACKET THEN DRINK BY MOUTH EVERY DAY BEFORE BREAKFAST (MIX WITH  15 ML WATER, LEAVE 2-3 MINUTES TO THICKEN, AND DRINK) 90 each 1   estrogens, conjugated, (PREMARIN) 0.9 MG tablet Take 1.25 mg by mouth. 2 tabs in the AM and 1 tab in the PM     famotidine (PEPCID) 40 MG tablet Take 1 tablet (40 mg total) by mouth daily. (Patient taking differently: Take 40 mg by mouth daily. Takes as needed) 90 tablet 3   LORazepam (ATIVAN) 0.5 MG tablet Take 1 tablet (0.5 mg total) by mouth every 6 (six) hours as needed for anxiety. 15 tablet 0   losartan (COZAAR) 25 MG tablet Take 1 tablet (25 mg total) by mouth daily. 90 tablet 3   meloxicam (MOBIC) 15 MG tablet Take 1 tablet (15 mg total) by mouth daily. with food 90 tablet 0   metoprolol succinate (TOPROL-XL) 25 MG 24 hr tablet TAKE 1 TABLET (25 MG TOTAL) BY MOUTH DAILY. 30 tablet 2   mometasone (NASONEX) 50 MCG/ACT nasal spray Place into the nose.     montelukast (SINGULAIR) 10 MG tablet Take 1 tablet (10 mg total) by mouth at bedtime. 90 tablet 3   Multiple Vitamin (MULTIVITAMIN) tablet Take 1 tablet by mouth daily. Nutralite --- takes 2x per day     NASACORT ALLERGY 24HR 55 MCG/ACT AERO nasal  inhaler 1 spray daily.     Omega-3 Fatty Acids (FISH OIL) 1200 MG CAPS      ondansetron (ZOFRAN) 4 MG tablet Take 1 tablet (4 mg total) by mouth every 8 (eight) hours as needed for nausea or vomiting. 4 tablet 0   pramipexole (MIRAPEX) 0.125 MG tablet Take 3 tabs by mouth at bedtime 270 tablet 3   rosuvastatin (CRESTOR) 40 MG tablet Take 1 tablet (40 mg total) by mouth daily. 90 tablet 0  sertraline (ZOLOFT) 50 MG tablet TAKE 1 TABLET BY MOUTH EVERY DAY 90 tablet 0   ZYRTEC ALLERGY 10 MG tablet Take 10 mg by mouth daily as needed.     Current Facility-Administered Medications  Medication Dose Route Frequency Provider Last Rate Last Admin   0.9 %  sodium chloride infusion  500 mL Intravenous Once Ladene Artist, MD        Allergies as of 02/28/2022 - Review Complete 02/28/2022  Allergen Reaction Noted   Moxifloxacin Anxiety, Palpitations, and Rash 12/02/2008   Clindamycin/lincomycin Other (See Comments) and Nausea And Vomiting 07/21/2020    Family History  Problem Relation Age of Onset   Hypertension Other    Cancer Other        lung, prostate   Heart disease Other    Colon cancer Neg Hx    Colon polyps Neg Hx    Esophageal cancer Neg Hx    Rectal cancer Neg Hx    Stomach cancer Neg Hx     Social History   Socioeconomic History   Marital status: Widowed    Spouse name: Not on file   Number of children: Not on file   Years of education: Not on file   Highest education level: 12th grade  Occupational History   Not on file  Tobacco Use   Smoking status: Never   Smokeless tobacco: Never  Vaping Use   Vaping Use: Never used  Substance and Sexual Activity   Alcohol use: Yes    Comment: 1 glass of wine monthly or so   Drug use: Never   Sexual activity: Yes    Birth control/protection: Post-menopausal, Surgical  Other Topics Concern   Not on file  Social History Narrative   Not on file   Social Determinants of Health   Financial Resource Strain: Low Risk   (10/02/2021)   Overall Financial Resource Strain (CARDIA)    Difficulty of Paying Living Expenses: Not hard at all  Food Insecurity: No Food Insecurity (10/02/2021)   Hunger Vital Sign    Worried About Running Out of Food in the Last Year: Never true    Ran Out of Food in the Last Year: Never true  Transportation Needs: No Transportation Needs (10/02/2021)   PRAPARE - Hydrologist (Medical): No    Lack of Transportation (Non-Medical): No  Physical Activity: Insufficiently Active (10/02/2021)   Exercise Vital Sign    Days of Exercise per Week: 1 day    Minutes of Exercise per Session: 10 min  Stress: No Stress Concern Present (10/02/2021)   Espy    Feeling of Stress : Not at all  Social Connections: Moderately Integrated (10/02/2021)   Social Connection and Isolation Panel [NHANES]    Frequency of Communication with Friends and Family: Twice a week    Frequency of Social Gatherings with Friends and Family: Twice a week    Attends Religious Services: More than 4 times per year    Active Member of [employer] or Organizations: Yes    Attends Archivist Meetings: More than 4 times per year    Marital Status: Widowed  Intimate Partner Violence: Not on file    Review of Systems:  All systems reviewed were negative except where noted in HPI.   Physical Exam: General:  Alert, well-developed, in NAD Head:  Normocephalic and atraumatic. Eyes:  Sclera clear, no icterus.   Conjunctiva pink. Ears:  Normal  auditory acuity. Mouth:  No deformity or lesions.  Neck:  Supple; no masses . Lungs:  Clear throughout to auscultation.   No wheezes, crackles, or rhonchi. No acute distress. Heart:  Regular rate and rhythm; no murmurs. Abdomen:  Soft, nondistended, nontender. No masses, hepatomegaly. No obvious masses.  Normal bowel .    Rectal:  Deferred   Msk:  Symmetrical without gross  deformities.. Pulses:  Normal pulses noted. Extremities:  Without edema. Neurologic:  Alert and  oriented x4;  grossly normal neurologically. Skin:  Intact without significant lesions or rashes. Psych:  Alert and cooperative. Normal mood and affect.   Impression / Plan:   Average risk CRC screening for colonoscopy.  Pricilla Riffle. Fuller Plan  02/28/2022, 3:21 PM See Shea Evans, [facility], to contact our on call provider

## 2022-03-01 ENCOUNTER — Telehealth: Payer: Self-pay

## 2022-03-01 NOTE — Telephone Encounter (Signed)
No answer, left message to call if having any issues or concerns, B.Jaking Thayer RN 

## 2022-03-06 ENCOUNTER — Encounter: Admitting: Gastroenterology

## 2022-04-25 ENCOUNTER — Encounter: Payer: Self-pay | Admitting: Family Medicine

## 2022-05-09 ENCOUNTER — Encounter: Payer: Self-pay | Admitting: Family Medicine

## 2022-05-09 ENCOUNTER — Other Ambulatory Visit: Payer: Self-pay | Admitting: Family Medicine

## 2022-05-09 DIAGNOSIS — G2581 Restless legs syndrome: Secondary | ICD-10-CM

## 2022-05-09 DIAGNOSIS — F410 Panic disorder [episodic paroxysmal anxiety] without agoraphobia: Secondary | ICD-10-CM

## 2022-05-10 MED ORDER — ROSUVASTATIN CALCIUM 40 MG PO TABS: 40.0000 mg | ORAL_TABLET | Freq: Every day | ORAL | 0 refills | Status: DC

## 2022-06-18 ENCOUNTER — Encounter: Payer: Self-pay | Admitting: *Deleted

## 2022-06-27 ENCOUNTER — Ambulatory Visit (INDEPENDENT_AMBULATORY_CARE_PROVIDER_SITE_OTHER): Admitting: Obstetrics & Gynecology

## 2022-06-27 ENCOUNTER — Encounter: Payer: Self-pay | Admitting: Obstetrics & Gynecology

## 2022-06-27 VITALS — BP 133/66 | HR 75 | Ht 63.0 in | Wt 220.0 lb

## 2022-06-27 DIAGNOSIS — F418 Other specified anxiety disorders: Secondary | ICD-10-CM | POA: Diagnosis not present

## 2022-06-27 DIAGNOSIS — N951 Menopausal and female climacteric states: Secondary | ICD-10-CM

## 2022-06-27 DIAGNOSIS — Z79890 Hormone replacement therapy (postmenopausal): Secondary | ICD-10-CM | POA: Diagnosis not present

## 2022-06-27 MED ORDER — ESTRADIOL 0.075 MG/24HR TD PTWK: 0.0750 mg | MEDICATED_PATCH | TRANSDERMAL | 12 refills | Status: DC

## 2022-06-27 MED ORDER — SERTRALINE HCL 100 MG PO TABS: 100.0000 mg | ORAL_TABLET | Freq: Every day | ORAL | 1 refills | Status: DC

## 2022-06-27 NOTE — Progress Notes (Signed)
   GYN VISIT Patient name: Tracy Strong MRN [id number]  Date of birth: 18-Aug-1960 Chief Complaint:   Discuss Menopause  History of Present Illness:   Tracy Strong is a 62 y.o. [place] female being seen today for the following concerns:  -Vasomotor symptoms: This has been an ongoing issue for 2 years since her hysterectomy she has been on hormone replacement therapy including the highest dose medication. Recently this was discontinued and started on venlafaxine which has made no improvement for her  Notes considerable hot flashes, insomnia, vaginal dryness.   Hot flashes daily and states she is absolutely miserable- rates her symptoms 10/10.    Previously seen by Physician for [facility]  No LMP recorded. Patient has had a hysterectomy.     09/12/2021    3:48 PM 02/21/2021   10:57 AM  Depression screen PHQ 2/9  Decreased Interest 0 0  Down, Depressed, Hopeless 0 0  PHQ - 2 Score 0 0  Altered sleeping 3 3  Tired, decreased energy 0 1  Change in appetite 0 0  Feeling bad or failure about yourself  0 0  Trouble concentrating 0 0  Moving slowly or fidgety/restless 0 0  Suicidal thoughts 0 0  PHQ-9 Score 3 4  Difficult doing work/chores Not difficult at all Somewhat difficult     Review of Systems:   Pertinent items are noted in HPI Denies fever/chills, dizziness, headaches, visual disturbances, fatigue, shortness of breath, chest pain, abdominal pain, vomiting. Pertinent History Reviewed:  Reviewed past medical,surgical, social, obstetrical and family history.  Reviewed problem list, medications and allergies. Physical Assessment:   Vitals:   06/27/22 1453  BP: 133/66  Pulse: 75  Weight: 220 lb (99.8 kg)  Height: 5\' 3"  (1.6 m)  Body mass index is 38.97 kg/m.       Physical Examination:   General appearance: alert, well appearing, and in no distress  Psych: mood appropriate, normal affect  Skin: warm & dry   Cardiovascular: normal heart rate noted  Respiratory: normal  respiratory effort, no distress  Extremities: no edema   Chaperone: N/A    Assessment & Plan:  1) vasomotor symptoms, HRT - Increase Zoloft from 50-100mg  daily -Plan to wean off venlafaxine as patient has not noted any improvement -Plan to restart Estrace patch- due to failure with lower doses plan to start at moderate/high dose -Start magnesium lotion at night -Increase weight lifting exercises - cut back on caffeine   []  Follow-up in 3 months- Goal 6/10 symptoms if still bothersome consider the following -increase Estrace patch -adding vaginal estrogen therapy -Discussed Veozah unfortunately not covered by her insurance  Meds ordered this encounter  Medications   estradiol (CLIMARA - DOSED IN MG/24 HR) 0.075 mg/24hr patch    Sig: Place 1 patch (0.075 mg total) onto the skin once a week.    Dispense:  4 patch    Refill:  12   sertraline (ZOLOFT) 100 MG tablet    Sig: Take 1 tablet (100 mg total) by mouth daily.    Dispense:  90 tablet    Refill:  1      Return in about 3 months (around 09/26/2022) for Medication follow up with MD.   Janyth Pupa, DO Attending [facility], [place] for [place], [place]

## 2022-07-10 ENCOUNTER — Encounter: Admitting: Obstetrics and Gynecology

## 2022-07-25 ENCOUNTER — Telehealth: Payer: Self-pay | Admitting: Obstetrics & Gynecology

## 2022-08-06 ENCOUNTER — Other Ambulatory Visit: Payer: Self-pay | Admitting: Family Medicine

## 2022-08-06 DIAGNOSIS — F410 Panic disorder [episodic paroxysmal anxiety] without agoraphobia: Secondary | ICD-10-CM

## 2022-08-30 ENCOUNTER — Other Ambulatory Visit: Payer: Self-pay | Admitting: Family Medicine

## 2022-08-30 DIAGNOSIS — K219 Gastro-esophageal reflux disease without esophagitis: Secondary | ICD-10-CM

## 2022-09-03 ENCOUNTER — Encounter: Payer: Self-pay | Admitting: Family Medicine

## 2022-09-03 ENCOUNTER — Ambulatory Visit (INDEPENDENT_AMBULATORY_CARE_PROVIDER_SITE_OTHER): Admitting: Family Medicine

## 2022-09-03 VITALS — BP 132/76 | HR 64 | Temp 97.8°F | Ht 63.0 in | Wt 224.2 lb

## 2022-09-03 DIAGNOSIS — R739 Hyperglycemia, unspecified: Secondary | ICD-10-CM

## 2022-09-03 DIAGNOSIS — G2581 Restless legs syndrome: Secondary | ICD-10-CM

## 2022-09-03 DIAGNOSIS — E785 Hyperlipidemia, unspecified: Secondary | ICD-10-CM

## 2022-09-03 DIAGNOSIS — I10 Essential (primary) hypertension: Secondary | ICD-10-CM

## 2022-09-03 DIAGNOSIS — R7303 Prediabetes: Secondary | ICD-10-CM

## 2022-09-03 LAB — COMPREHENSIVE METABOLIC PANEL
ALT: 47 U/L — ABNORMAL HIGH (ref 0–35)
AST: 40 U/L — ABNORMAL HIGH (ref 0–37)
Albumin: 4.3 g/dL (ref 3.5–5.2)
Alkaline Phosphatase: 80 U/L (ref 39–117)
BUN: 24 mg/dL — ABNORMAL HIGH (ref 6–23)
CO2: 30 mEq/L (ref 19–32)
Calcium: 9.4 mg/dL (ref 8.4–10.5)
Chloride: 101 mEq/L (ref 96–112)
Creatinine, Ser: 1.08 mg/dL (ref 0.40–1.20)
GFR: 54.77 mL/min — ABNORMAL LOW (ref >60.00)
Glucose, Bld: 88 mg/dL (ref 70–99)
Potassium: 4.4 mEq/L (ref 3.5–5.1)
Sodium: 139 mEq/L (ref 135–145)
Total Bilirubin: 0.4 mg/dL (ref 0.2–1.2)
Total Protein: 7.3 g/dL (ref 6.0–8.3)

## 2022-09-03 LAB — HEMOGLOBIN A1C: Hgb A1c MFr Bld: 6.3 % (ref 4.6–6.5)

## 2022-09-03 LAB — LIPID PANEL
Cholesterol: 157 mg/dL (ref 0–200)
HDL: 64.2 mg/dL (ref >39.00)
LDL Cholesterol: 73 mg/dL (ref 0–99)
NonHDL: 93.13
Total CHOL/HDL Ratio: 2
Triglycerides: 103 mg/dL (ref 0.0–149.0)
VLDL: 20.6 mg/dL (ref 0.0–40.0)

## 2022-09-03 MED ORDER — METOPROLOL SUCCINATE ER 25 MG PO TB24: 25.0000 mg | ORAL_TABLET | Freq: Every day | ORAL | 3 refills | Status: DC

## 2022-09-03 MED ORDER — MELOXICAM 15 MG PO TABS: 15.0000 mg | ORAL_TABLET | Freq: Every day | ORAL | 0 refills | Status: DC

## 2022-09-03 MED ORDER — PRAMIPEXOLE DIHYDROCHLORIDE 0.125 MG PO TABS: ORAL_TABLET | ORAL | 3 refills | Status: DC

## 2022-09-03 MED ORDER — LOSARTAN POTASSIUM 25 MG PO TABS: 25.0000 mg | ORAL_TABLET | Freq: Every day | ORAL | 3 refills | Status: DC

## 2022-09-03 MED ORDER — ROSUVASTATIN CALCIUM 40 MG PO TABS: 40.0000 mg | ORAL_TABLET | Freq: Every day | ORAL | 3 refills | Status: DC
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
## 2022-09-18 ENCOUNTER — Encounter: Payer: Self-pay | Admitting: Family Medicine

## 2022-09-18 DIAGNOSIS — R400 Somnolence: Secondary | ICD-10-CM

## 2022-09-20 NOTE — Telephone Encounter (Signed)
Noted  

## 2022-10-08 ENCOUNTER — Telehealth: Payer: Self-pay | Admitting: Family Medicine

## 2022-10-08 NOTE — Telephone Encounter (Signed)
Pt called to say she just saw her Allergist.   Pt has been experiencing heaviness in her chest, feeling clammy, BP 138/81, gained 9 lbs since March  Pt says she needs an EKG, as per Allergist.   Pt would like to know what MD thinks?  Please advise.

## 2022-10-08 NOTE — Telephone Encounter (Signed)
Patient informed of the message below and voiced understanding  

## 2022-10-08 NOTE — Telephone Encounter (Signed)
I spoke with the patient and she reported chest heaviness that started today. Patient reports no dyspnea or difficulty breathing at this time. Patient reported she just seen her allergist and they stated that they could hear extra heartbeats while listening to her heart. Patient was advised to visit ED or urgent care if symptoms worsen or she has any difficulty breathing, patient voiced understanding. Patient has been scheduled to see Dr. Salomon Fick tomorrow at 1.

## 2022-10-09 ENCOUNTER — Ambulatory Visit (INDEPENDENT_AMBULATORY_CARE_PROVIDER_SITE_OTHER): Admitting: Family Medicine

## 2022-10-09 ENCOUNTER — Ambulatory Visit (INDEPENDENT_AMBULATORY_CARE_PROVIDER_SITE_OTHER)

## 2022-10-09 ENCOUNTER — Encounter: Payer: Self-pay | Admitting: Family Medicine

## 2022-10-09 VITALS — BP 148/94 | HR 95 | Temp 98.7°F | Wt 227.0 lb

## 2022-10-09 DIAGNOSIS — R9431 Abnormal electrocardiogram [ECG] [EKG]: Secondary | ICD-10-CM

## 2022-10-09 DIAGNOSIS — R0789 Other chest pain: Secondary | ICD-10-CM | POA: Diagnosis not present

## 2022-10-09 DIAGNOSIS — I10 Essential (primary) hypertension: Secondary | ICD-10-CM | POA: Diagnosis not present

## 2022-10-09 DIAGNOSIS — R61 Generalized hyperhidrosis: Secondary | ICD-10-CM

## 2022-10-09 LAB — CBC WITH DIFFERENTIAL/PLATELET
Basophils Absolute: 0.1 10*3/uL (ref 0.0–0.1)
Basophils Relative: 0.7 % (ref 0.0–3.0)
Eosinophils Absolute: 0.1 10*3/uL (ref 0.0–0.7)
Eosinophils Relative: 1.4 % (ref 0.0–5.0)
HCT: 41.2 % (ref 36.0–46.0)
Hemoglobin: 13.6 g/dL (ref 12.0–15.0)
Lymphocytes Relative: 23 % (ref 12.0–46.0)
Lymphs Abs: 2.1 10*3/uL (ref 0.7–4.0)
MCHC: 33.1 g/dL (ref 30.0–36.0)
MCV: 87.3 fl (ref 78.0–100.0)
Monocytes Absolute: 0.5 10*3/uL (ref 0.1–1.0)
Monocytes Relative: 5 % (ref 3.0–12.0)
Neutro Abs: 6.4 10*3/uL (ref 1.4–7.7)
Neutrophils Relative %: 69.9 % (ref 43.0–77.0)
Platelets: 251 10*3/uL (ref 150.0–400.0)
RBC: 4.72 Mil/uL (ref 3.87–5.11)
RDW: 14.3 % (ref 11.5–15.5)
WBC: 9.2 10*3/uL (ref 4.0–10.5)

## 2022-10-09 LAB — COMPREHENSIVE METABOLIC PANEL
ALT: 40 U/L — ABNORMAL HIGH (ref 0–35)
AST: 43 U/L — ABNORMAL HIGH (ref 0–37)
Albumin: 4.4 g/dL (ref 3.5–5.2)
Alkaline Phosphatase: 85 U/L (ref 39–117)
BUN: 15 mg/dL (ref 6–23)
CO2: 27 mEq/L (ref 19–32)
Calcium: 10 mg/dL (ref 8.4–10.5)
Chloride: 102 mEq/L (ref 96–112)
Creatinine, Ser: 0.83 mg/dL (ref 0.40–1.20)
GFR: 75.07 mL/min (ref >60.00)
Glucose, Bld: 88 mg/dL (ref 70–99)
Potassium: 4 mEq/L (ref 3.5–5.1)
Sodium: 139 mEq/L (ref 135–145)
Total Bilirubin: 0.5 mg/dL (ref 0.2–1.2)
Total Protein: 7.4 g/dL (ref 6.0–8.3)

## 2022-10-09 NOTE — Progress Notes (Signed)
Established Patient Office Visit   Subjective  Patient ID: Tracy Strong, female    DOB: 08-04-1960  Age: 62 y.o. MRN: [id number]  Chief Complaint  Patient presents with   Chest Pain    In the middle, felt heavy. Yesterday, nose has been running, had some tingling in left rign and pinky and left wrist. Has been sweating a lot more than normal, face was red BP has been up and down this am BP 142/74 @ 2:45, around noon it was 152/73      Patient is a 62 year old female followed by Dr. Caryl Never and seen for acute concern.  Patient endorses rhinorrhea, chest pressure, numbness in fingers of L hand, liable BP and diaphoresis x 2 days.  Patient states symptoms started yesterday upon waking up.  Patient had appointment with allergist yesterday.  Was advised to see PCP as extra beats may have been heard on exam.  Pt had hiccups for started 2 nights ago as well as increase in burping, headache.  Patient also notes feeling lightheaded while in exam room.  Patient had a heart cath 1 year ago with [facility] which showed nonobstructive CAD.  Patient denies having a cardiologist.  Patient taking aspirin 81 mg daily.  Denies LE edema or pain.  BP this morning 152/73.  Chest Pain     Past Medical History:  Diagnosis Date   Allergy    seasonal   Anxiety    Arthritis    Asthma    Cancer (HCC) 03/2018   skin cancer   Complication of anesthesia    took awhile waking up   Contact lens/glasses fitting    wears glasses or contacts   DEPRESSION 12/02/2008   Diverticulosis    GERD 12/02/2008   Headache(784.0) 12/02/2008   HYPERLIPIDEMIA 12/02/2008   Hypertension    INSOMNIA 12/02/2008   PREDIABETES 12/02/2008   URINARY INCONTINENCE 12/02/2008   Past Surgical History:  Procedure Laterality Date   COLONOSCOPY  12/15/2009   normal   KNEE ARTHROSCOPY Left 2022   2 x prior to replacement surgery   KNEE SURGERY Left 03/15/2021   knee replacement   LEFT HEART CATH  08/2021   normal   MOHS SURGERY   2007   nose   MOHS SURGERY  04/20/2018   PARTIAL HYSTERECTOMY  2000   SHOULDER ARTHROSCOPY Right 2010   SHOULDER ARTHROSCOPY  01/07/2012   Procedure: ARTHROSCOPY SHOULDER;  Surgeon: Velna Ochs, MD;  Location: [facility];  Service: Orthopedics;  Laterality: Left;  LEFT SHOULDER SCOPE with acromioplasty and distal clavicle resection   Social History   Tobacco Use   Smoking status: Never   Smokeless tobacco: Never  Vaping Use   Vaping status: Never Used  Substance Use Topics   Alcohol use: Yes    Comment: 1 glass of wine monthly or so   Drug use: Never   Family History  Problem Relation Age of Onset   Hypertension Other    Cancer Other        lung, prostate   Heart disease Other    Colon cancer Neg Hx    Colon polyps Neg Hx    Esophageal cancer Neg Hx    Rectal cancer Neg Hx    Stomach cancer Neg Hx    Allergies  Allergen Reactions   Moxifloxacin Anxiety, Palpitations, Rash, Swelling and Other (See Comments)    REACTION: AVALOX syncope episode, panic attacks  Syncope  REACTION: AVALOX syncope episode, panic attacks  Syncope   Clindamycin/Lincomycin Other (See Comments) and Nausea And Vomiting   Clindamycin Other (See Comments)      Review of Systems  Cardiovascular:  Positive for chest pain.   Negative unless stated above    Objective:     BP (!) 148/94 (BP Location: Left Arm, Patient Position: Sitting, Cuff Size: Large)   Pulse 95   Temp 98.7 F (37.1 C) (Oral)   Wt 227 lb (103 kg)   SpO2 94%   BMI 40.21 kg/m  BP Readings from Last 3 Encounters:  10/09/22 (!) 148/94  09/03/22 132/76  06/27/22 133/66   Wt Readings from Last 3 Encounters:  10/09/22 227 lb (103 kg)  09/03/22 224 lb 3.2 oz (101.7 kg)  06/27/22 220 lb (99.8 kg)      Physical Exam Constitutional:      General: She is not in acute distress.    Appearance: Normal appearance.  HENT:     Head: Normocephalic and atraumatic.     Nose: Nose normal.      Mouth/Throat:     Mouth: Mucous membranes are moist.  Neck:     Vascular: No carotid bruit.  Cardiovascular:     Rate and Rhythm: Normal rate and regular rhythm.     Heart sounds: Normal heart sounds. No murmur heard.    No friction rub. No gallop.  Pulmonary:     Effort: Pulmonary effort is normal. No respiratory distress.     Breath sounds: Normal breath sounds. No wheezing, rhonchi or rales.  Skin:    General: Skin is warm and dry.  Neurological:     Mental Status: She is alert and oriented to person, place, and time.     No results found for any visits on 10/09/22.    Assessment & Plan:  Chest pressure -     EKG 12-Lead -     D-dimer, quantitative -     Troponin I - -     Comprehensive metabolic panel -     CBC with Differential/Platelet -     Ambulatory referral to Cardiology -     DG Chest 2 View  Diaphoresis  T wave inversion in EKG -     Ambulatory referral to Cardiology -     DG Chest 2 View  Essential hypertension -     Ambulatory referral to Cardiology -     DG Chest 2 View   Concern for MI given symptoms.  Discussed other possible causes of symptoms including PE, PNA, etc.  EKG this visit with NSR, new nonspecific T wave inversion in V2 and V3.  EKG from 01/06/14 days for comparison.  Discussed obtaining labs and CXR.  Given strict ED precautions.  Continue current meds.  Referral to cardiology placed.  Return if symptoms worsen or fail to improve.    Deeann Saint, MD

## 2022-10-09 NOTE — Patient Instructions (Signed)
Orders for labs and a chest x-ray were placed.  Referral to cardiology was also ordered.  Further recommendations based on lab results.  For continued or worsening symptoms proceed to nearest ED.

## 2022-10-10 ENCOUNTER — Other Ambulatory Visit: Payer: Self-pay | Admitting: Family Medicine

## 2022-10-10 ENCOUNTER — Encounter: Payer: Self-pay | Admitting: Family Medicine

## 2022-10-10 DIAGNOSIS — R0789 Other chest pain: Secondary | ICD-10-CM

## 2022-10-10 DIAGNOSIS — R7989 Other specified abnormal findings of blood chemistry: Secondary | ICD-10-CM

## 2022-10-10 LAB — TROPONIN I: Troponin I: <3 ng/L (ref <=47)

## 2022-10-10 LAB — D-DIMER, QUANTITATIVE: D-Dimer, Quant: 0.5 mcg/mL FEU — ABNORMAL HIGH (ref <0.50)

## 2022-10-11 ENCOUNTER — Ambulatory Visit
Admission: RE | Admit: 2022-10-11 | Discharge: 2022-10-11 | Disposition: A | Discharge status: Home / Self Care | Source: Ambulatory Visit | Attending: Family Medicine | Admitting: Family Medicine

## 2022-10-11 DIAGNOSIS — R7989 Other specified abnormal findings of blood chemistry: Secondary | ICD-10-CM

## 2022-10-11 DIAGNOSIS — R0789 Other chest pain: Secondary | ICD-10-CM

## 2022-10-11 MED ORDER — IOPAMIDOL (ISOVUE-370) INJECTION 76%
75.0000 mL | Freq: Once | INTRAVENOUS | Status: AC | PRN
  Administered 2022-10-11: 75 mL via INTRAVENOUS

## 2022-10-11 NOTE — Telephone Encounter (Signed)
Noted  

## 2022-10-14 ENCOUNTER — Encounter: Payer: Self-pay | Admitting: Family Medicine

## 2022-10-16 ENCOUNTER — Encounter: Payer: Self-pay | Admitting: Family Medicine

## 2022-10-17 ENCOUNTER — Other Ambulatory Visit: Payer: Self-pay | Admitting: Family Medicine

## 2022-10-17 ENCOUNTER — Other Ambulatory Visit: Payer: Self-pay | Admitting: Obstetrics & Gynecology

## 2022-10-17 DIAGNOSIS — Z79890 Hormone replacement therapy (postmenopausal): Secondary | ICD-10-CM

## 2022-10-17 DIAGNOSIS — G2581 Restless legs syndrome: Secondary | ICD-10-CM

## 2022-10-19 MED ORDER — LORAZEPAM 0.5 MG PO TABS: 0.5000 mg | ORAL_TABLET | Freq: Four times a day (QID) | ORAL | 0 refills | Status: AC | PRN

## 2022-10-24 ENCOUNTER — Encounter: Payer: Self-pay | Admitting: Obstetrics and Gynecology

## 2022-10-24 ENCOUNTER — Ambulatory Visit (INDEPENDENT_AMBULATORY_CARE_PROVIDER_SITE_OTHER): Admitting: Obstetrics and Gynecology

## 2022-10-24 VITALS — BP 141/85 | HR 71 | Ht 63.0 in | Wt 225.0 lb

## 2022-10-24 DIAGNOSIS — N951 Menopausal and female climacteric states: Secondary | ICD-10-CM

## 2022-10-24 MED ORDER — ESTRADIOL 0.1 MG/24HR TD PTWK: 0.1000 mg | MEDICATED_PATCH | TRANSDERMAL | 0 refills | Status: DC

## 2022-10-24 MED ORDER — ESTRADIOL 0.1 MG/24HR TD PTWK: 0.1000 mg | MEDICATED_PATCH | TRANSDERMAL | 12 refills | Status: DC

## 2022-10-24 NOTE — Progress Notes (Signed)
62 yo postmenopausal returning for follow up on management of vasomotor symptoms. Patient has been using climara 0.075 mg and zoloft. She added magnesium to help with insomnia since her last visit. She reports persistent of hot flashes and night sweats despite the climara. She has not been able to exercise due to a recent knew injury but plans to start soon. She is without any other complaints. She is requesting an increase in climara dosing  Past Medical History:  Diagnosis Date   Allergy    seasonal   Anxiety    Arthritis    Asthma    Cancer (HCC) 03/2018   skin cancer   Complication of anesthesia    took awhile waking up   Contact lens/glasses fitting    wears glasses or contacts   DEPRESSION 12/02/2008   Diverticulosis    GERD 12/02/2008   Headache(784.0) 12/02/2008   HYPERLIPIDEMIA 12/02/2008   Hypertension    INSOMNIA 12/02/2008   PREDIABETES 12/02/2008   URINARY INCONTINENCE 12/02/2008   Past Surgical History:  Procedure Laterality Date   COLONOSCOPY  12/15/2009   normal   KNEE ARTHROSCOPY Left 2022   2 x prior to replacement surgery   KNEE SURGERY Left 03/15/2021   knee replacement   LEFT HEART CATH  08/2021   normal   MOHS SURGERY  2007   nose   MOHS SURGERY  04/20/2018   PARTIAL HYSTERECTOMY  2000   SHOULDER ARTHROSCOPY Right 2010   SHOULDER ARTHROSCOPY  01/07/2012   Procedure: ARTHROSCOPY SHOULDER;  Surgeon: Velna Ochs, MD;  Location: [facility];  Service: Orthopedics;  Laterality: Left;  LEFT SHOULDER SCOPE with acromioplasty and distal clavicle resection   Family History  Problem Relation Age of Onset   Hypertension Other    Cancer Other        lung, prostate   Heart disease Other    Colon cancer Neg Hx    Colon polyps Neg Hx    Esophageal cancer Neg Hx    Rectal cancer Neg Hx    Stomach cancer Neg Hx    Social History   Tobacco Use   Smoking status: Never   Smokeless tobacco: Never  Vaping Use   Vaping status: Never Used   Substance Use Topics   Alcohol use: Yes    Comment: 1 glass of wine monthly or so   Drug use: Never   ROS See pertinent in HPI. All other systems reviewed and non contributory Blood pressure (!) 141/85, pulse 71, height 5\' 3"  (1.6 m), weight 225 lb (102.1 kg). GENERAL: Well-developed, well-nourished female in no acute distress.  NEURO: alert and oriented x 3  A/P 62 yo here for follow up on vasomotor symptoms - Continue with zoloft and magnesium - Advised patient to incorporate weight bearing exercises - Climara increased to 0.1 mg with the goal of decrease in frequency and/or intensity of hot flashes and night sweats rather than complete resolution - TSH today - Patient will be contacted with abnormal results

## 2022-10-24 NOTE — Telephone Encounter (Signed)
Attempted to speak to pt. Spouse answer. He states pt is currently with a cardiologist appt. Will call us back after she is done.

## 2022-10-25 ENCOUNTER — Ambulatory Visit (INDEPENDENT_AMBULATORY_CARE_PROVIDER_SITE_OTHER): Admitting: Family Medicine

## 2022-10-25 ENCOUNTER — Encounter: Payer: Self-pay | Admitting: Family Medicine

## 2022-10-25 VITALS — BP 134/66 | HR 70 | Temp 97.9°F | Ht 63.0 in | Wt 225.3 lb

## 2022-10-25 DIAGNOSIS — R0789 Other chest pain: Secondary | ICD-10-CM

## 2022-10-25 DIAGNOSIS — R1013 Epigastric pain: Secondary | ICD-10-CM

## 2022-10-25 MED ORDER — METOCLOPRAMIDE HCL 10 MG PO TABS: 10.0000 mg | ORAL_TABLET | Freq: Three times a day (TID) | ORAL | 2 refills | Status: DC

## 2022-10-25 NOTE — Patient Instructions (Signed)
Continue with daily Nexium and continue Pepcid 20 mg at night  Continue with the Reglan every 6 hours.  I will set up upper GI films.

## 2022-10-25 NOTE — Progress Notes (Unsigned)
Established Patient Office Visit  Subjective   Patient ID: Tracy Strong, female    DOB: 01/06/1961  Age: 62 y.o. MRN: [id number]  Chief Complaint  Patient presents with   Hospitalization Follow-up    HPI  {History (Optional):23778} Tracy Strong has history of hypertension, GERD, recurrent depression, hyperlipidemia, prediabetes.  This past Saturday she woke up with some diaphoresis and elevated blood pressure along with left shoulder pain and some sensation of heaviness in the chest.  She went to the ER for evaluation.  Chest x-ray and EKG unremarkable for any acute changes.  Troponins negative.  Labs unremarkable.  She was felt to have GI related symptoms and prescribed Reglan 10 mg 4 times daily and Pepcid 20 mg twice daily in addition to her usual Nexium 40 mg daily.  She has noted significant improvement since then.  She had some diffuse upper abdominal pressure prior to starting the Reglan.  ER physician had suggested either GI follow-up or upper GI consideration for further evaluation.  She denies any dysphagia.  Appetite and weight stable.  Denies any recent right upper quadrant tenderness or pain.  2 weeks ago she had been seen here with some chest pain symptoms and minimally elevated D-dimer 0.5.  CT angiogram was done on July 19 which showed no PE.  There was comment of gallstones on her scan.  She actually had cardiac cath done on 09-10-2021 with 30% RCA lesion otherwise normal.  Past Medical History:  Diagnosis Date   Allergy    seasonal   Anxiety    Arthritis    Asthma    Cancer (HCC) 03/2018   skin cancer   Complication of anesthesia    took awhile waking up   Contact lens/glasses fitting    wears glasses or contacts   DEPRESSION 12/02/2008   Diverticulosis    GERD 12/02/2008   Headache(784.0) 12/02/2008   HYPERLIPIDEMIA 12/02/2008   Hypertension    INSOMNIA 12/02/2008   PREDIABETES 12/02/2008   URINARY INCONTINENCE 12/02/2008   Past Surgical History:  Procedure  Laterality Date   COLONOSCOPY  12/15/2009   normal   KNEE ARTHROSCOPY Left 2022   2 x prior to replacement surgery   KNEE SURGERY Left 03/15/2021   knee replacement   LEFT HEART CATH  08/2021   normal   MOHS SURGERY  2007   nose   MOHS SURGERY  04/20/2018   PARTIAL HYSTERECTOMY  2000   SHOULDER ARTHROSCOPY Right 2010   SHOULDER ARTHROSCOPY  01/07/2012   Procedure: ARTHROSCOPY SHOULDER;  Surgeon: Velna Ochs, MD;  Location: [facility];  Service: Orthopedics;  Laterality: Left;  LEFT SHOULDER SCOPE with acromioplasty and distal clavicle resection    reports that she has never smoked. She has never used smokeless tobacco. She reports current alcohol use. She reports that she does not use drugs. family history includes Cancer in an other family member; Heart disease in an other family member; Hypertension in an other family member. Allergies  Allergen Reactions   Moxifloxacin Anxiety, Palpitations, Rash, Swelling and Other (See Comments)    REACTION: AVALOX syncope episode, panic attacks  Syncope  REACTION: AVALOX syncope episode, panic attacks  Syncope   Clindamycin/Lincomycin Other (See Comments) and Nausea And Vomiting   Clindamycin Other (See Comments)    Review of Systems  Constitutional:  Negative for chills, fever and weight loss.  Respiratory:  Negative for cough, sputum production, shortness of breath and wheezing.   Cardiovascular:  Positive for chest pain.  Negative for leg swelling.      Objective:     BP 134/66 (BP Location: Left Arm, Patient Position: Sitting, Cuff Size: Large)   Pulse 70   Temp 97.9 F (36.6 C) (Oral)   Ht 5\' 3"  (1.6 m)   Wt 225 lb 4.8 oz (102.2 kg)   SpO2 98%   BMI 39.91 kg/m  BP Readings from Last 3 Encounters:  10/25/22 134/66  10/24/22 (!) 141/85  10/09/22 (!) 148/94   Wt Readings from Last 3 Encounters:  10/25/22 225 lb 4.8 oz (102.2 kg)  10/24/22 225 lb (102.1 kg)  10/09/22 227 lb (103 kg)       Physical Exam Vitals reviewed.  Constitutional:      General: She is not in acute distress.    Appearance: She is well-developed.  Eyes:     Pupils: Pupils are equal, round, and reactive to light.  Neck:     Thyroid: No thyromegaly.     Vascular: No JVD.  Cardiovascular:     Rate and Rhythm: Normal rate and regular rhythm.     Heart sounds:     No gallop.  Pulmonary:     Effort: Pulmonary effort is normal. No respiratory distress.     Breath sounds: Normal breath sounds. No wheezing or rales.  Abdominal:     General: There is no distension.     Palpations: Abdomen is soft. There is no mass.     Tenderness: There is no abdominal tenderness. There is no guarding or rebound.  Musculoskeletal:     Cervical back: Neck supple.  Neurological:     Mental Status: She is alert.      No results found for any visits on 10/25/22.  Last CBC Lab Results  Component Value Date   WBC 9.2 10/09/2022   HGB 13.6 10/09/2022   HCT 41.2 10/09/2022   MCV 87.3 10/09/2022   MCH 29.7 01/05/2014   RDW 14.3 10/09/2022   PLT 251.0 10/09/2022   Last metabolic panel Lab Results  Component Value Date   GLUCOSE 88 10/09/2022   NA 139 10/09/2022   K 4.0 10/09/2022   CL 102 10/09/2022   CO2 27 10/09/2022   BUN 15 10/09/2022   CREATININE 0.83 10/09/2022   GFR 75.07 10/09/2022   CALCIUM 10.0 10/09/2022   PROT 7.4 10/09/2022   ALBUMIN 4.4 10/09/2022   BILITOT 0.5 10/09/2022   ALKPHOS 85 10/09/2022   AST 43 (H) 10/09/2022   ALT 40 (H) 10/09/2022   ANIONGAP 14 01/05/2014   Last lipids Lab Results  Component Value Date   CHOL 157 09/03/2022   HDL 64.20 09/03/2022   LDLCALC 73 09/03/2022   LDLDIRECT 81.0 08/04/2019   TRIG 103.0 09/03/2022   CHOLHDL 2 09/03/2022   Last hemoglobin A1c Lab Results  Component Value Date   HGBA1C 6.3 09/03/2022   Last thyroid functions Lab Results  Component Value Date   TSH 2.600 10/24/2022      The 10-year ASCVD risk score (Arnett DK, et al.,  2019) is: 5.3%    Assessment & Plan:   62 year old female with recent upper abdominal discomfort with recent evaluations as above unrevealing.  No evidence for acute coronary syndrome.  She does have pending follow-up with cardiologist in September.  Previous cardiac cath showing minimal nonobstructive CAD.  Patient has seen improvement with Reglan and Pepcid in addition to her usual Nexium.  She does not have any red flags such as appetite change, weight loss, dysphagia,  melena, bloody stools, hematemesis.    -Continue Nexium 40 mg daily.  Continue to supplement with Pepcid 20 mg nightly. -Provided 1 refill of Reglan 10 mg 4 times daily with caution for potential for extraparametal side effects -Set up upper GI for further evaluation.  If unrevealing and symptoms persist consider GI referral. -She did have incidental gallstones noted on recent x-ray but current symptoms do not sound suggestive of symptomatic cholelithiasis  Evelena Peat, MD

## 2022-11-19 ENCOUNTER — Other Ambulatory Visit: Payer: Self-pay | Admitting: Obstetrics and Gynecology

## 2022-11-21 ENCOUNTER — Ambulatory Visit (HOSPITAL_COMMUNITY)
Admission: RE | Admit: 2022-11-21 | Discharge: 2022-11-21 | Disposition: A | Discharge status: Home / Self Care | Source: Ambulatory Visit | Attending: Family Medicine | Admitting: Family Medicine

## 2022-11-21 DIAGNOSIS — R1013 Epigastric pain: Secondary | ICD-10-CM | POA: Insufficient documentation

## 2022-12-05 NOTE — Progress Notes (Signed)
Tracy Carwin, MD Reason for referral-chest pain  HPI: 62 year old female for evaluation of chest pain at request of Abbe Amsterdam, MD.  Echocardiogram June 2023 showed normal LV function.  Cardiac catheterization at [facility] June 2023 showed diffuse mild irregularities, 30% proximal RCA and normal LV function.  Patient seen with chest pain July 2024.  Troponin normal, AST 43, ALT 40, hemoglobin 13.6.  CTA showed no pulmonary embolus but there was note of cholelithiasis.  TSH August 2024 2.6.  Upper GI August 2024 negative. Patient states that over the past 2 months she has had recurrent chest pain.  It is different from her previous episode that led to her catheterization.  It occurs predominantly at night and is in the upper chest and left chest area radiating to her left upper extremity.  It is described as a sharp pain.  Can last minutes to 2 hours.  Resolved spontaneously.  There is associated shortness of breath and diaphoresis but no nausea.  The pain is not pleuritic.  She does not have exertional chest pain.  She does have dyspnea on exertion but no orthopnea, PND or pedal edema.  She has had palpitations in the past but not recently.  Cardiology now asked to evaluate.  Current Outpatient Medications  Medication Sig Dispense Refill   albuterol (VENTOLIN HFA) 108 (90 Base) MCG/ACT inhaler Inhale into the lungs.     aspirin EC 81 MG tablet Take 81 mg by mouth daily. Swallow whole.     budesonide-formoterol (SYMBICORT) 160-4.5 MCG/ACT inhaler Inhale 2 puffs into the lungs 2 (two) times daily as needed.     esomeprazole (NEXIUM) 40 MG packet MIX 1 PACKET THEN DRINK BY MOUTH EVERY DAY BEFORE BREAKFAST (MIX WITH 15 ML WATER, LEAVE 2-3 MINUTES TO THICKEN, AND DRINK) 90 each 1   estradiol (CLIMARA) 0.1 mg/24hr patch Place 1 patch (0.1 mg total) onto the skin once a week. 4 patch 0   famotidine (PEPCID) 40 MG tablet Take 1 tablet (40 mg total) by mouth daily. (Patient taking differently:  Take 40 mg by mouth daily. Takes as needed) 90 tablet 3   LORazepam (ATIVAN) 0.5 MG tablet Take 1 tablet (0.5 mg total) by mouth every 6 (six) hours as needed for anxiety. 15 tablet 0   losartan (COZAAR) 25 MG tablet Take 1 tablet (25 mg total) by mouth daily. 90 tablet 3   meloxicam (MOBIC) 15 MG tablet Take 1 tablet (15 mg total) by mouth daily. with food 90 tablet 0   metoprolol succinate (TOPROL-XL) 25 MG 24 hr tablet Take 1 tablet (25 mg total) by mouth daily. 90 tablet 3   mometasone (NASONEX) 50 MCG/ACT nasal spray Place into the nose.     montelukast (SINGULAIR) 10 MG tablet Take 1 tablet (10 mg total) by mouth at bedtime. 90 tablet 3   Multiple Vitamin (MULTIVITAMIN) tablet Take 1 tablet by mouth daily. Nutralite --- takes 2x per day     NASACORT ALLERGY 24HR 55 MCG/ACT AERO nasal inhaler 1 spray daily.     Omega-3 Fatty Acids (FISH OIL) 1200 MG CAPS      pramipexole (MIRAPEX) 0.125 MG tablet Take 3 tabs by mouth at bedtime 270 tablet 3   rosuvastatin (CRESTOR) 40 MG tablet Take 1 tablet (40 mg total) by mouth daily. 90 tablet 3   estradiol (CLIMARA - DOSED IN MG/24 HR) 0.075 mg/24hr patch Place 1 patch (0.075 mg total) onto the skin once a week. 4 patch 12  estradiol (CLIMARA) 0.1 mg/24hr patch Place 1 patch (0.1 mg total) onto the skin once a week. 4 patch 12   metoCLOPramide (REGLAN) 10 MG tablet Take 1 tablet (10 mg total) by mouth 4 (four) times daily -  before meals and at bedtime for 10 days. 60 tablet 2   sertraline (ZOLOFT) 100 MG tablet Take 1 tablet (100 mg total) by mouth daily. 90 tablet 1   No current facility-administered medications for this visit.    Allergies  Allergen Reactions   Moxifloxacin Anxiety, Palpitations, Rash, Swelling and Other (See Comments)    REACTION: AVALOX syncope episode, panic attacks  Syncope  REACTION: AVALOX syncope episode, panic attacks  Syncope   Clindamycin/Lincomycin Other (See Comments) and Nausea And Vomiting   Clindamycin Other  (See Comments)     Past Medical History:  Diagnosis Date   Allergy    seasonal   Anxiety    Arthritis    Asthma    Cancer (HCC) 03/2018   skin cancer   Complication of anesthesia    took awhile waking up   Contact lens/glasses fitting    wears glasses or contacts   DEPRESSION 12/02/2008   Diverticulosis    GERD 12/02/2008   Headache(784.0) 12/02/2008   HYPERLIPIDEMIA 12/02/2008   Hypertension    INSOMNIA 12/02/2008   PREDIABETES 12/02/2008   URINARY INCONTINENCE 12/02/2008    Past Surgical History:  Procedure Laterality Date   COLONOSCOPY  12/15/2009   normal   KNEE ARTHROSCOPY Left 2022   2 x prior to replacement surgery   KNEE SURGERY Left 03/15/2021   knee replacement   LEFT HEART CATH  08/2021   normal   MOHS SURGERY  2007   nose   MOHS SURGERY  04/20/2018   PARTIAL HYSTERECTOMY  2000   SHOULDER ARTHROSCOPY Right 2010   SHOULDER ARTHROSCOPY  01/07/2012   Procedure: ARTHROSCOPY SHOULDER;  Surgeon: Velna Ochs, MD;  Location: [facility];  Service: Orthopedics;  Laterality: Left;  LEFT SHOULDER SCOPE with acromioplasty and distal clavicle resection    Social History   Socioeconomic History   Marital status: Married    Spouse name: Not on file   Number of children: 2   Years of education: Not on file   Highest education level: 12th grade  Occupational History   Not on file  Tobacco Use   Smoking status: Never   Smokeless tobacco: Never  Vaping Use   Vaping status: Never Used  Substance and Sexual Activity   Alcohol use: Yes    Comment: 1 glass of wine monthly or so   Drug use: Never   Sexual activity: Yes    Birth control/protection: Post-menopausal, Surgical  Other Topics Concern   Not on file  Social History Narrative   Not on file   Social Determinants of Health   Financial Resource Strain: Low Risk  (10/08/2022)   Overall Financial Resource Strain (CARDIA)    Difficulty of Paying Living Expenses: Not hard at all   Food Insecurity: No Food Insecurity (10/08/2022)   Hunger Vital Sign    Worried About Running Out of Food in the Last Year: Never true    Ran Out of Food in the Last Year: Never true  Transportation Needs: No Transportation Needs (10/08/2022)   PRAPARE - Administrator, Civil Service (Medical): No    Lack of Transportation (Non-Medical): No  Physical Activity: Insufficiently Active (10/08/2022)   Exercise Vital Sign    Days of  Exercise per Week: 1 day    Minutes of Exercise per Session: 10 min  Stress: No Stress Concern Present (10/08/2022)   [employer] of Occupational Health - Occupational Stress Questionnaire    Feeling of Stress : Only a little  Social Connections: Moderately Integrated (10/08/2022)   Social Connection and Isolation Panel [NHANES]    Frequency of Communication with Friends and Family: Three times a week    Frequency of Social Gatherings with Friends and Family: Once a week    Attends Religious Services: More than 4 times per year    Active Member of [employer] or Organizations: Yes    Attends Banker Meetings: More than 4 times per year    Marital Status: Widowed  Intimate Partner Violence: Not At Risk (10/19/2022)   Received from [facility]   HITS    Over the last 12 months how often did your partner physically hurt you?: 1    Over the last 12 months how often did your partner insult you or talk down to you?: 1    Over the last 12 months how often did your partner threaten you with physical harm?: 1    Over the last 12 months how often did your partner scream or curse at you?: 1    Family History  Problem Relation Age of Onset   Hypertension Other    Cancer Other        lung, prostate   Heart disease Other    Colon cancer Neg Hx    Colon polyps Neg Hx    Esophageal cancer Neg Hx    Rectal cancer Neg Hx    Stomach cancer Neg Hx     ROS: no fevers or chills, productive cough, hemoptysis, dysphasia, odynophagia, melena,  hematochezia, dysuria, hematuria, rash, seizure activity, orthopnea, PND, pedal edema, claudication. Remaining systems are negative.  Physical Exam:   Blood pressure 138/62, pulse 75, height 5\' 3"  (1.6 m), weight 224 lb (101.6 kg), SpO2 98%.  General:  Well developed/well nourished in NAD Skin warm/dry Patient not depressed No peripheral clubbing Back-normal HEENT-normal/normal eyelids Neck supple/normal carotid upstroke bilaterally; no bruits; no JVD; no thyromegaly chest - CTA/ normal expansion CV - RRR/normal S1 and S2; no murmurs, rubs or gallops;  PMI nondisplaced Abdomen -NT/ND, no HSM, no mass, + bowel sounds, no bruit 2+ femoral pulses, no bruits Ext-no edema, chords, 2+ DP Neuro-grossly nonfocal  ECG -October 09, 2022-normal sinus rhythm with no ST changes.  Personally reviewed  A/P  1 chest pain-symptoms are atypical.  However she is extremely concerned about these.  Will arrange coronary CTA to rule out obstructive coronary disease.  May need to see a gastroenterologist in the future if symptoms persist.  2 hyperlipidemia-continue statin.  3 hypertension-continue to follow.  Goal systolic blood pressure 130 and diastolic 85.  4 coronary artery disease-minimal on previous catheterization.  Continue aspirin and statin.  Olga Millers, MD

## 2022-12-08 ENCOUNTER — Encounter: Payer: Self-pay | Admitting: Family Medicine

## 2022-12-11 ENCOUNTER — Encounter: Payer: Self-pay | Admitting: Cardiology

## 2022-12-11 ENCOUNTER — Ambulatory Visit (INDEPENDENT_AMBULATORY_CARE_PROVIDER_SITE_OTHER): Admitting: Cardiology

## 2022-12-11 VITALS — BP 138/62 | HR 75 | Ht 63.0 in | Wt 224.0 lb

## 2022-12-11 DIAGNOSIS — R072 Precordial pain: Secondary | ICD-10-CM | POA: Diagnosis not present

## 2022-12-11 DIAGNOSIS — E7800 Pure hypercholesterolemia, unspecified: Secondary | ICD-10-CM | POA: Diagnosis not present

## 2022-12-11 DIAGNOSIS — I10 Essential (primary) hypertension: Secondary | ICD-10-CM | POA: Diagnosis not present

## 2022-12-11 MED ORDER — METOPROLOL TARTRATE 100 MG PO TABS: ORAL_TABLET | ORAL | 0 refills | Status: DC

## 2022-12-11 NOTE — Patient Instructions (Signed)
    Testing/Procedures:   Your cardiac CT will be scheduled at   [facility] [address] [place], [place] [address] [phone]   If scheduled at [facility], please arrive at the [facility] and Children's Entrance (Entrance C2) of [facility] 30 minutes prior to test start time. You can use the FREE valet parking offered at entrance C (encouraged to control the heart rate for the test)  Proceed to the [facility] Radiology Department (first floor) to check-in and test prep.  All radiology patients and guests should use entrance C2 at [facility], accessed from [facility], even though the hospital's physical address listed is [address].      Please follow these instructions carefully (unless otherwise directed):  An IV will be required for this test and Nitroglycerin will be given.    On the Night Before the Test: Be sure to Drink plenty of water. Do not consume any caffeinated/decaffeinated beverages or chocolate 12 hours prior to your test. Do not take any antihistamines 12 hours prior to your test.   On the Day of the Test: Drink plenty of water until 1 hour prior to the test. Do not eat any food 1 hour prior to test. You may take your regular medications prior to the test.  Take metoprolol (Lopressor) 100 mg two hours prior to test. FEMALES- please wear underwire-free bra if available, avoid dresses & tight clothing      After the Test: Drink plenty of water. After receiving IV contrast, you may experience a mild flushed feeling. This is normal. On occasion, you may experience a mild rash up to 24 hours after the test. This is not dangerous. If this occurs, you can take Benadryl 25 mg and increase your fluid intake. If you experience trouble breathing, this can be serious. If it is severe call 911 IMMEDIATELY. If it is mild, please call our office.  We will call to schedule your test 2-4 weeks out  understanding that some insurance companies will need an authorization prior to the service being performed.   For more information and frequently asked questions, please visit our website : [email or web address]  For non-scheduling related questions, please contact the cardiac imaging nurse navigator should you have any questions/concerns: Cardiac Imaging Nurse Navigators Direct Office Dial: [phone]   For scheduling needs, including cancellations and rescheduling, please call [place], [phone].    Follow-Up: At [facility], you and your health needs are our priority.  As part of our continuing mission to provide you with exceptional heart care, we have created designated Provider Care Teams.  These Care Teams include your primary Cardiologist (physician) and Advanced Practice Providers (APPs -  Physician Assistants and Nurse Practitioners) who all work together to provide you with the care you need, when you need it.  We recommend signing up for the patient portal called "MyChart".  Sign up information is provided on this After Visit Summary.  MyChart is used to connect with patients for Virtual Visits (Telemedicine).  Patients are able to view lab/test results, encounter notes, upcoming appointments, etc.  Non-urgent messages can be sent to your provider as well.   To learn more about what you can do with MyChart, go to [email or web address].    Your next appointment:   12 month(s)  Provider:   Olga Millers, MD

## 2022-12-12 ENCOUNTER — Encounter (HOSPITAL_COMMUNITY): Payer: Self-pay

## 2022-12-17 ENCOUNTER — Ambulatory Visit (HOSPITAL_COMMUNITY)
Admission: RE | Admit: 2022-12-17 | Discharge: 2022-12-17 | Disposition: A | Discharge status: Home / Self Care | Source: Ambulatory Visit | Attending: Cardiology | Admitting: Cardiology

## 2022-12-17 DIAGNOSIS — R072 Precordial pain: Secondary | ICD-10-CM

## 2022-12-17 MED ORDER — NITROGLYCERIN 0.4 MG SL SUBL
0.8000 mg | SUBLINGUAL_TABLET | Freq: Once | SUBLINGUAL | Status: AC
  Administered 2022-12-17: 0.8 mg via SUBLINGUAL

## 2022-12-17 MED ORDER — IOHEXOL 350 MG/ML SOLN
95.0000 mL | Freq: Once | INTRAVENOUS | Status: AC | PRN
  Administered 2022-12-17: 95 mL via INTRAVENOUS

## 2022-12-17 MED ORDER — NITROGLYCERIN 0.4 MG SL SUBL
SUBLINGUAL_TABLET | SUBLINGUAL | Status: AC
  Filled 2022-12-17: qty 2

## 2022-12-17 NOTE — Progress Notes (Signed)
Patient tolerated CT well. Vital signs stable encourage to drink water throughout day.Reasons explained and verbalized understanding. Ambulated steady gait.   

## 2022-12-31 ENCOUNTER — Ambulatory Visit: Admitting: Family Medicine

## 2023-01-07 ENCOUNTER — Encounter: Payer: Self-pay | Admitting: Obstetrics and Gynecology

## 2023-01-09 ENCOUNTER — Other Ambulatory Visit: Payer: Self-pay | Admitting: Obstetrics and Gynecology

## 2023-01-09 ENCOUNTER — Telehealth: Payer: Self-pay | Admitting: Family Medicine

## 2023-01-09 MED ORDER — ESTRADIOL 1 MG PO TABS: 1.0000 mg | ORAL_TABLET | Freq: Every day | ORAL | 3 refills | Status: AC

## 2023-01-09 MED ORDER — ESTRADIOL 1 MG PO TABS: 1.0000 mg | ORAL_TABLET | Freq: Every day | ORAL | 0 refills | Status: DC

## 2023-01-09 NOTE — Telephone Encounter (Signed)
Patient dropped off document  Sleep Therapy Order Form , to be filled out by provider. Patient requested to send it back via Fax within 7-days. Document is located in providers tray at front office.Please advise at  fax [phone]

## 2023-01-10 NOTE — Telephone Encounter (Signed)
Forms completed by PCP and faxed to number below.

## 2023-01-31 ENCOUNTER — Other Ambulatory Visit: Payer: Self-pay | Admitting: Obstetrics and Gynecology

## 2023-02-25 ENCOUNTER — Other Ambulatory Visit: Payer: Self-pay | Admitting: Family Medicine

## 2023-02-25 DIAGNOSIS — K219 Gastro-esophageal reflux disease without esophagitis: Secondary | ICD-10-CM

## 2023-03-12 ENCOUNTER — Other Ambulatory Visit: Payer: Self-pay | Admitting: Family Medicine

## 2023-03-12 DIAGNOSIS — G2581 Restless legs syndrome: Secondary | ICD-10-CM

## 2023-04-10 ENCOUNTER — Other Ambulatory Visit: Payer: Self-pay | Admitting: *Deleted

## 2023-04-10 ENCOUNTER — Encounter: Payer: Self-pay | Admitting: Obstetrics & Gynecology

## 2023-04-10 DIAGNOSIS — Z79890 Hormone replacement therapy (postmenopausal): Secondary | ICD-10-CM

## 2023-04-10 MED ORDER — SERTRALINE HCL 100 MG PO TABS: 100.0000 mg | ORAL_TABLET | Freq: Every day | ORAL | 1 refills | Status: DC

## 2023-04-10 NOTE — Telephone Encounter (Signed)
Pt sent a my chart message that she has been trying to get a refill on her Zoloft.  I believe that the refill authorizations went to another office as we have not [facility].  Per pt request a 30 days supply of [facility]ft [facility] to [facility] and a 90 sent to [facility] Mail order.

## 2023-04-21 ENCOUNTER — Encounter: Payer: Self-pay | Admitting: Family Medicine

## 2023-04-21 ENCOUNTER — Ambulatory Visit (INDEPENDENT_AMBULATORY_CARE_PROVIDER_SITE_OTHER): Admitting: Family Medicine

## 2023-04-21 VITALS — BP 140/78 | HR 60 | Temp 98.1°F | Wt 229.4 lb

## 2023-04-21 DIAGNOSIS — R238 Other skin changes: Secondary | ICD-10-CM

## 2023-04-21 NOTE — Progress Notes (Signed)
Established Patient Office Visit  Subjective   Patient ID: Tracy Strong, female    DOB: Mar 14, 1961  Age: 63 y.o. MRN: [id number]  Chief Complaint  Patient presents with   Abscess    HPI   Raynelle Fanning is seen for concern for possible abscess left buttock.  First noted a couple weeks ago.  She has had some soreness.  She noticed little bit of induration.  She tried to squeeze this several times but did not see any drainage.  No past history of perianal or perirectal abscess.  No recent fever or chills.  Have been doing some warm soaks.  Last June she developed concern for possible obstructive sleep apnea.  She had some mild snoring, dry mouth, daytime somnolence, fatigue, and was observed waking up at night gasping for air.  She had home sleep study reportedly in November with [facility]..  Could not locate those results today  Past Medical History:  Diagnosis Date   Allergy    seasonal   Anxiety    Arthritis    Asthma    Cancer (HCC) 03/2018   skin cancer   Complication of anesthesia    took awhile waking up   Contact lens/glasses fitting    wears glasses or contacts   DEPRESSION 12/02/2008   Diverticulosis    GERD 12/02/2008   Headache(784.0) 12/02/2008   HYPERLIPIDEMIA 12/02/2008   Hypertension    INSOMNIA 12/02/2008   PREDIABETES 12/02/2008   URINARY INCONTINENCE 12/02/2008   Past Surgical History:  Procedure Laterality Date   COLONOSCOPY  12/15/2009   normal   KNEE ARTHROSCOPY Left 2022   2 x prior to replacement surgery   KNEE SURGERY Left 03/15/2021   knee replacement   LEFT HEART CATH  08/2021   normal   MOHS SURGERY  2007   nose   MOHS SURGERY  04/20/2018   PARTIAL HYSTERECTOMY  2000   SHOULDER ARTHROSCOPY Right 2010   SHOULDER ARTHROSCOPY  01/07/2012   Procedure: ARTHROSCOPY SHOULDER;  Surgeon: Velna Ochs, MD;  Location: [facility];  Service: Orthopedics;  Laterality: Left;  LEFT SHOULDER SCOPE with acromioplasty and distal  clavicle resection    reports that she has never smoked. She has never used smokeless tobacco. She reports current alcohol use. She reports that she does not use drugs. family history includes Cancer in an other family member; Heart disease in an other family member; Hypertension in an other family member. Allergies  Allergen Reactions   Moxifloxacin Anxiety, Palpitations, Rash, Swelling and Other (See Comments)    REACTION: AVALOX syncope episode, panic attacks  Syncope  REACTION: AVALOX syncope episode, panic attacks  Syncope   Clindamycin/Lincomycin Other (See Comments) and Nausea And Vomiting   Clindamycin Other (See Comments)     Review of Systems  Constitutional:  Negative for chills and fever.  Gastrointestinal:  Negative for blood in stool and diarrhea.      Objective:     BP (!) 140/78 (BP Location: Left Arm, Patient Position: Sitting, Cuff Size: Normal)   Pulse 60   Temp 98.1 F (36.7 C) (Oral)   Wt 229 lb 6.4 oz (104.1 kg)   SpO2 97%   BMI 40.64 kg/m  BP Readings from Last 3 Encounters:  04/21/23 (!) 140/78  12/17/22 129/71  12/11/22 138/62   Wt Readings from Last 3 Encounters:  04/21/23 229 lb 6.4 oz (104.1 kg)  12/11/22 224 lb (101.6 kg)  10/25/22 225 lb 4.8 oz (102.2 kg)  Physical Exam Vitals reviewed.  Constitutional:      General: She is not in acute distress.    Appearance: She is not ill-appearing.  Cardiovascular:     Rate and Rhythm: Normal rate and regular rhythm.  Pulmonary:     Effort: Pulmonary effort is normal.     Breath sounds: Normal breath sounds.  Skin:    Comments: Left buttock examined.  She has a area of irritation about 3 cm from the anal region.  Somewhat excoriated surface.  Mild induration.  No cellulitis changes.  No warmth.  No fluctuance.    Neurological:     Mental Status: She is alert.      No results found for any visits on 04/21/23.    The 10-year ASCVD risk score (Arnett DK, et al., 2019) is:  5.7%    Assessment & Plan:   #1 skin irritation left buttock.  Does have a little bit of induration and query resolving abscess.  No fluctuance.  No cellulitis changes.  No indication for I&D at this time. -Recommend avoiding squeezing or scratching region -Keep clean with soap and water -Continue warm sitz baths daily -Consider little bit of topical such as zinc oxide on skin which appears somewhat dry -Follow-up for any increased erythema, swelling or other concerns  #2 concern for obstructive sleep apnea.  Patient reportedly had home study in November but cannot locate results today.  We had some forms to complete for CPAP and will try to track down those results   Evelena Peat, MD

## 2023-04-21 NOTE — Patient Instructions (Signed)
Avoid squeezing or scratching area.  Warm tub baths daily  Watch for any increased redness or swelling.  Consider topical zinc oxide

## 2023-07-07 ENCOUNTER — Other Ambulatory Visit: Payer: Self-pay | Admitting: Family Medicine

## 2023-07-07 DIAGNOSIS — K219 Gastro-esophageal reflux disease without esophagitis: Secondary | ICD-10-CM

## 2023-07-22 ENCOUNTER — Encounter: Payer: Self-pay | Admitting: Family Medicine

## 2023-07-22 ENCOUNTER — Ambulatory Visit: Payer: Self-pay

## 2023-07-22 NOTE — Telephone Encounter (Signed)
  Chief Complaint: cat scratch Symptoms: small cat scratch to right lower leg Frequency: 3 days ago Pertinent Negatives: Patient denies fever, bleeding Disposition: [] ED /[] Urgent Care (no appt availability in office) / [x] Appointment(In office/virtual)/ []  [place] [facility]/ [] Home Care/ [] Refused Recommended Disposition /[] [facility]/ []  Follow-up with PCP Additional Notes: Patient states she has cleaned the wound with soap and water, and is applying OTC antibiotic ointment. Patient states she sent in a mychart message and was told to make an appointment. Offered patient appointment with PCP this afternoon and she declined. Patient scheduled for acute visit tomorrow morning.   Copied from CRM [id number]. Topic: Clinical - Red Word Triage >> Jul 22, 2023  1:53 PM Deaijah H wrote: Red Word that prompted transfer to Nurse Triage: Cat scratch / infected Reason for Disposition  [1] Puncture wound (hole through the skin) AND [2] from a cat bite (or deep claw puncture wound)  Answer Assessment - Initial Assessment Questions 1. APPEARANCE of INJURY: "What does the injury look like?"      Redness has improved. Purple look to it, puncture.  2. SIZE: "How large is the cut?"      About the size of a dime.  3. BLEEDING: "Is it bleeding now?" If Yes, ask: "Is it difficult to stop?"      Denies any bleeding now.  4. LOCATION: "Where is the injury located?"      Right lower leg, between shin and calf.  5. ONSET: "How long ago did the injury occur?"      3 days ago.  6. MECHANISM: "Tell me how it happened."      Patient's cat scratched her. She states her cat is up to date on vaccinations.  7. TETANUS: "When was the last tetanus booster?"     Last tetanus was 2018.  8. PREGNANCY: "Is there any chance you are pregnant?" "When was your last menstrual period?"     N/A.  Protocols used: Skin Injury-A-AH, Animal Bite-A-AH

## 2023-07-23 ENCOUNTER — Ambulatory Visit: Admitting: Family Medicine

## 2023-07-23 ENCOUNTER — Encounter: Payer: Self-pay | Admitting: Family Medicine

## 2023-07-23 VITALS — BP 132/78 | HR 71 | Temp 98.6°F | Wt 225.7 lb

## 2023-07-23 DIAGNOSIS — I10 Essential (primary) hypertension: Secondary | ICD-10-CM

## 2023-07-23 DIAGNOSIS — W5503XA Scratched by cat, initial encounter: Secondary | ICD-10-CM | POA: Diagnosis not present

## 2023-07-23 NOTE — Progress Notes (Signed)
 Established Patient Office Visit  Subjective   Patient ID: Tracy Strong, female    DOB: 11-Apr-1960  Age: 63 y.o. MRN: [id number]  Chief Complaint  Patient presents with   Animal Bite    Patient complains of cat bite, x3 days     HPI   Tracy Strong is seen with cat scratch on her left leg which occurred about 3 days ago.  No cat bite.  Her cat is 54 years old.  Tracy Strong's last tetanus was 2018.  She had a little bit of initial erythema but this has actually gone down significantly over the past day.  She has not noted any regional lymphadenopathy.  No fevers or chills.  She has hypertension treated with low-dose losartan  and metoprolol .  Initial blood pressure up today but did come down significantly after rest.  Past Medical History:  Diagnosis Date   Allergy    seasonal   Anxiety    Arthritis    Asthma    Cancer (HCC) 03/2018   skin cancer   Complication of anesthesia    took awhile waking up   Contact lens/glasses fitting    wears glasses or contacts   DEPRESSION 12/02/2008   Diverticulosis    GERD 12/02/2008   Headache(784.0) 12/02/2008   HYPERLIPIDEMIA 12/02/2008   Hypertension    INSOMNIA 12/02/2008   PREDIABETES 12/02/2008   URINARY INCONTINENCE 12/02/2008   Past Surgical History:  Procedure Laterality Date   COLONOSCOPY  12/15/2009   normal   KNEE ARTHROSCOPY Left 2022   2 x prior to replacement surgery   KNEE SURGERY Left 03/15/2021   knee replacement   LEFT HEART CATH  08/2021   normal   MOHS SURGERY  2007   nose   MOHS SURGERY  04/20/2018   PARTIAL HYSTERECTOMY  2000   SHOULDER ARTHROSCOPY Right 2010   SHOULDER ARTHROSCOPY  01/07/2012   Procedure: ARTHROSCOPY SHOULDER;  Surgeon: Alphonzo Ask, MD;  Location: [facility];  Service: Orthopedics;  Laterality: Left;  LEFT SHOULDER SCOPE with acromioplasty and distal clavicle resection    reports that she has never smoked. She has never used smokeless tobacco. She reports current alcohol  use. She reports that she does not use drugs. family history includes Cancer in an other family member; Heart disease in an other family member; Hypertension in an other family member. Allergies  Allergen Reactions   Moxifloxacin Anxiety, Palpitations, Rash, Swelling and Other (See Comments)    REACTION: AVALOX syncope episode, panic attacks  Syncope  REACTION: AVALOX syncope episode, panic attacks  Syncope   Clindamycin/Lincomycin Other (See Comments) and Nausea And Vomiting   Clindamycin Other (See Comments)    Review of Systems  Constitutional:  Negative for chills and fever.      Objective:     BP 132/78 (BP Location: Left Arm, Cuff Size: Large)   Pulse 71   Temp 98.6 F (37 C) (Oral)   Wt 225 lb 11.2 oz (102.4 kg)   SpO2 95%   BMI 39.98 kg/m  BP Readings from Last 3 Encounters:  07/23/23 132/78  04/21/23 (!) 140/78  12/17/22 129/71   Wt Readings from Last 3 Encounters:  07/23/23 225 lb 11.2 oz (102.4 kg)  04/21/23 229 lb 6.4 oz (104.1 kg)  12/11/22 224 lb (101.6 kg)      Physical Exam Vitals reviewed.  Constitutional:      General: She is not in acute distress.    Appearance: She is not ill-appearing.  Cardiovascular:     Rate and Rhythm: Normal rate and regular rhythm.  Pulmonary:     Effort: Pulmonary effort is normal.     Breath sounds: Normal breath sounds.  Skin:    Comments: Small eschar left lower leg.  No surrounding erythema.  No fluctuance.  Minimally tender.  No warmth.  Neurological:     Mental Status: She is alert.      No results found for any visits on 07/23/23.  Last CBC Lab Results  Component Value Date   WBC 9.2 10/09/2022   HGB 13.6 10/09/2022   HCT 41.2 10/09/2022   MCV 87.3 10/09/2022   MCH 29.7 01/05/2014   RDW 14.3 10/09/2022   PLT 251.0 10/09/2022   Last metabolic panel Lab Results  Component Value Date   GLUCOSE 88 10/09/2022   NA 139 10/09/2022   K 4.0 10/09/2022   CL 102 10/09/2022   CO2 27 10/09/2022    BUN 15 10/09/2022   CREATININE 0.83 10/09/2022   GFR 75.07 10/09/2022   CALCIUM  10.0 10/09/2022   PROT 7.4 10/09/2022   ALBUMIN 4.4 10/09/2022   BILITOT 0.5 10/09/2022   ALKPHOS 85 10/09/2022   AST 43 (H) 10/09/2022   ALT 40 (H) 10/09/2022   ANIONGAP 14 01/05/2014   Last lipids Lab Results  Component Value Date   CHOL 157 09/03/2022   HDL 64.20 09/03/2022   LDLCALC 73 09/03/2022   LDLDIRECT 81.0 08/04/2019   TRIG 103.0 09/03/2022   CHOLHDL 2 09/03/2022   Last hemoglobin A1c Lab Results  Component Value Date   HGBA1C 6.3 09/03/2022      The 10-year ASCVD risk score (Arnett DK, et al., 2019) is: 5.8%    Assessment & Plan:   Recent cat scratch involving left lower leg.  Small eschar.  There is no history of cat bite.  She has no signs of cellulitis or secondary infection this time.  We did discuss cat scratch disease/fever.  Watch out for any fever, chills, or regional adenopathy.  Her tetanus is up-to-date.  Continue to monitor blood pressure and be in touch if consistently greater than 140/90.  She does have history of prediabetes and will need follow-up A1c within the next few months  Glean Lamy, MD

## 2023-09-25 ENCOUNTER — Other Ambulatory Visit: Payer: Self-pay | Admitting: Family Medicine

## 2023-10-07 ENCOUNTER — Encounter: Payer: Self-pay | Admitting: Obstetrics and Gynecology

## 2023-10-27 ENCOUNTER — Encounter: Payer: Self-pay | Admitting: Obstetrics & Gynecology

## 2023-10-27 ENCOUNTER — Other Ambulatory Visit: Payer: Self-pay | Admitting: Family Medicine

## 2023-10-27 ENCOUNTER — Ambulatory Visit: Admitting: Obstetrics & Gynecology

## 2023-10-27 VITALS — BP 137/72 | HR 64 | Ht 65.0 in | Wt 223.0 lb

## 2023-10-27 DIAGNOSIS — Z79890 Hormone replacement therapy (postmenopausal): Secondary | ICD-10-CM

## 2023-10-27 DIAGNOSIS — N951 Menopausal and female climacteric states: Secondary | ICD-10-CM | POA: Diagnosis not present

## 2023-10-27 DIAGNOSIS — N9089 Other specified noninflammatory disorders of vulva and perineum: Secondary | ICD-10-CM

## 2023-10-27 MED ORDER — SULFAMETHOXAZOLE-TRIMETHOPRIM 800-160 MG PO TABS: 1.0000 | ORAL_TABLET | Freq: Two times a day (BID) | ORAL | 1 refills | Status: AC

## 2023-10-27 MED ORDER — ESTRADIOL 1 MG PO TABS: 1.0000 mg | ORAL_TABLET | Freq: Every day | ORAL | 3 refills | Status: AC

## 2023-10-27 NOTE — Progress Notes (Unsigned)
   Subjective:    Patient ID: Tracy Strong Atkinson, female    DOB: [birth year], 63 y.o.   MRN: [id number]  HPI  63 yo female presents for right vaginal mass that are painful.  They gan get worse and eventualy go away for the past 2-3 months. Nothing squeezing and nothing comes out.  Cortsione cream has helped with itching but no help with making itgoing away.     Review of Systems     Objective:   Physical Exam        Assessment & Plan:   63 yo female with labial masses now resolving; ? Boils vs mild HS.   Bactrim  7 day course.  These 2 lesions are almost resolved.  Will give one refilll incase the lesions reoccur and she can take immediately and see if the abx markedly improves clinical course.   Best to come in when mass first occurs so we can see at it's peak.  HSV unlikely Refill on estradiol .

## 2024-01-08 ENCOUNTER — Other Ambulatory Visit: Payer: Self-pay | Admitting: Family Medicine

## 2024-01-08 DIAGNOSIS — K219 Gastro-esophageal reflux disease without esophagitis: Secondary | ICD-10-CM

## 2024-01-13 ENCOUNTER — Encounter: Payer: Self-pay | Admitting: Family Medicine

## 2024-01-14 ENCOUNTER — Encounter: Payer: Self-pay | Admitting: Family Medicine

## 2024-01-14 ENCOUNTER — Ambulatory Visit: Admitting: Family Medicine

## 2024-01-14 ENCOUNTER — Telehealth: Payer: Self-pay

## 2024-01-14 VITALS — BP 130/70 | HR 88 | Temp 98.2°F | Wt 221.5 lb

## 2024-01-14 DIAGNOSIS — R7309 Other abnormal glucose: Secondary | ICD-10-CM

## 2024-01-14 DIAGNOSIS — R052 Subacute cough: Secondary | ICD-10-CM

## 2024-01-14 LAB — POCT GLYCOSYLATED HEMOGLOBIN (HGB A1C): Hemoglobin A1C: 5.7 % — AB (ref 4.0–5.6)

## 2024-01-14 NOTE — Progress Notes (Signed)
 Established Patient Office Visit  Subjective   Patient ID: Tracy Strong, female    DOB: 1961-01-15  Age: 63 y.o. MRN: [id number]  Chief Complaint  Patient presents with   Cough        Fatigue   Night Sweats    HPI   Ziana had called earlier today requesting chest x-ray.  She is seeing her allergist apparently multiple times recently.  She has had 3 weeks now of some cough and intermittent sweats and fatigue.  She states she was treated with Zithromax about a week ago and was started just yesterday on oral prednisone  40 mg daily and Airsupra inhaler last Friday.  Cough is unimproved.  Severe at times.  No reported fevers or chills.  Addalie does have history of prediabetes with no recent A1c.  Her other medical problems include history of restless leg syndrome, chronic insomnia, recurrent depression, hyperlipidemia, hypertension  Past Medical History:  Diagnosis Date   Allergy    seasonal   Anxiety    Arthritis    Asthma    Cancer (HCC) 03/2018   skin cancer   Complication of anesthesia    took awhile waking up   Contact lens/glasses fitting    wears glasses or contacts   DEPRESSION 12/02/2008   Diverticulosis    GERD 12/02/2008   Headache(784.0) 12/02/2008   HYPERLIPIDEMIA 12/02/2008   Hypertension    INSOMNIA 12/02/2008   PREDIABETES 12/02/2008   URINARY INCONTINENCE 12/02/2008   Past Surgical History:  Procedure Laterality Date   COLONOSCOPY  12/15/2009   normal   KNEE ARTHROSCOPY Left 2022   2 x prior to replacement surgery   KNEE SURGERY Left 03/15/2021   knee replacement   LEFT HEART CATH  08/2021   normal   MOHS SURGERY  2007   nose   MOHS SURGERY  04/20/2018   PARTIAL HYSTERECTOMY  2000   SHOULDER ARTHROSCOPY Right 2010   SHOULDER ARTHROSCOPY  01/07/2012   Procedure: ARTHROSCOPY SHOULDER;  Surgeon: Maude KANDICE Herald, MD;  Location: [facility];  Service: Orthopedics;  Laterality: Left;  LEFT SHOULDER SCOPE with acromioplasty and  distal clavicle resection    reports that she has never smoked. She has never used smokeless tobacco. She reports current alcohol use. She reports that she does not use drugs. family history includes Cancer in an other family member; Heart disease in an other family member; Hypertension in an other family member. Allergies  Allergen Reactions   Moxifloxacin Anxiety, Palpitations, Rash, Swelling and Other (See Comments)    REACTION: AVALOX syncope episode, panic attacks  Syncope  REACTION: AVALOX syncope episode, panic attacks  Syncope   Clindamycin/Lincomycin Other (See Comments) and Nausea And Vomiting   Clindamycin Other (See Comments)    Review of Systems  Constitutional:  Negative for chills and fever.  Respiratory:  Positive for cough and wheezing. Negative for hemoptysis.   Cardiovascular:  Negative for chest pain.      Objective:     BP 130/70   Pulse 88   Temp 98.2 F (36.8 C) (Oral)   Wt 221 lb 8 oz (100.5 kg)   SpO2 95%   BMI 36.86 kg/m  BP Readings from Last 3 Encounters:  01/14/24 130/70  10/27/23 137/72  07/23/23 132/78   Wt Readings from Last 3 Encounters:  01/14/24 221 lb 8 oz (100.5 kg)  10/27/23 223 lb (101.2 kg)  07/23/23 225 lb 11.2 oz (102.4 kg)      Physical Exam  Vitals reviewed.  Constitutional:      General: She is not in acute distress.    Appearance: She is not ill-appearing.  Cardiovascular:     Rate and Rhythm: Normal rate and regular rhythm.  Pulmonary:     Effort: Pulmonary effort is normal.     Breath sounds: Normal breath sounds. No wheezing or rales.  Neurological:     Mental Status: She is alert.      Results for orders placed or performed in visit on 01/14/24  POC HgB A1c  Result Value Ref Range   Hemoglobin A1C 5.7 (A) 4.0 - 5.6 %   HbA1c POC (<> result, manual entry)     HbA1c, POC (prediabetic range)     HbA1c, POC (controlled diabetic range)        The 10-year ASCVD risk score (Arnett DK, et al., 2019) is:  5.6%    Assessment & Plan:   #1 subacute cough.  She has history of reactive airway disease.  Has seen allergist and currently on oral prednisone  and finished Zithromax recently.  She takes Airsupra inhaler.  No respiratory distress at this time.  O2 sats 95% room air.  Does not have any reported fevers and no rales.  Chest x-ray order placed.  Follow-up promptly for any fever or increasing shortness of breath  #2 history of prediabetes.  We recommended A1c with her being placed recently on prednisone .  This came back 5.7 which is improved from last visit.  Continue low glycemic diet   No follow-ups on file.    Wolm Scarlet, MD

## 2024-01-14 NOTE — Telephone Encounter (Signed)
Patient scheduled for OV today to discuss.

## 2024-01-14 NOTE — Patient Instructions (Signed)
 Go for CXR at [facility]..    A1C improved at 5.7%.

## 2024-01-14 NOTE — Telephone Encounter (Signed)
 Copied from CRM #[id number]. Topic: Clinical - Request for Lab/Test Order >> Jan 14, 2024  1:22 PM Tracy Strong wrote: Reason for CRM: Patient returning phone call to Alenna Russell about the Xray and would like it to be ordered at the [facility]  [address] [place], [place] [address] [phone]

## 2024-01-15 ENCOUNTER — Ambulatory Visit (INDEPENDENT_AMBULATORY_CARE_PROVIDER_SITE_OTHER)

## 2024-01-15 DIAGNOSIS — R052 Subacute cough: Secondary | ICD-10-CM | POA: Diagnosis not present

## 2024-01-18 ENCOUNTER — Ambulatory Visit: Payer: Self-pay | Admitting: Family Medicine

## 2024-02-02 ENCOUNTER — Encounter: Payer: Self-pay | Admitting: Obstetrics and Gynecology

## 2024-02-06 ENCOUNTER — Other Ambulatory Visit: Payer: Self-pay

## 2024-02-06 DIAGNOSIS — Z79890 Hormone replacement therapy (postmenopausal): Secondary | ICD-10-CM

## 2024-02-06 MED ORDER — SERTRALINE HCL 100 MG PO TABS: 100.0000 mg | ORAL_TABLET | Freq: Every day | ORAL | 0 refills | Status: DC

## 2024-02-06 MED ORDER — SERTRALINE HCL 100 MG PO TABS
100.0000 mg | ORAL_TABLET | Freq: Every day | ORAL | 0 refills | Status: DC
Start: 2024-02-06 — End: 2024-02-06

## 2024-02-09 NOTE — Progress Notes (Signed)
  Subjective:    Patient ID: Tracy Strong is a 63 y.o. female here for a Vaccines visit.      Are you sick today?: No  Have you ever had a serious reaction to any vaccine in the past?: No  Have you felt dizzy or faint before, during or after an immunization?: No                    Lifestyle: Arelia has no history on file for tobacco use.       Assessment/Plan:   I reviewed with the patient/guardian the vaccine history including vaccine schedules and deemed the requested vaccine appropriate.  The VIS was reviewed and an opportunity was provided to patient/guardian to ask any questions which were answered.  Patient/guardian agreed with management plan.   Vaccine administered in accordance with [facility] guidelines.  Pt advised to contact [facility] if adverse event occurs.

## 2024-02-28 ENCOUNTER — Other Ambulatory Visit: Payer: Self-pay | Admitting: Obstetrics and Gynecology

## 2024-02-28 DIAGNOSIS — Z79890 Hormone replacement therapy (postmenopausal): Secondary | ICD-10-CM

## 2024-03-24 ENCOUNTER — Encounter: Payer: Self-pay | Admitting: Family Medicine

## 2024-03-24 DIAGNOSIS — Z79890 Hormone replacement therapy (postmenopausal): Secondary | ICD-10-CM

## 2024-03-26 MED ORDER — SERTRALINE HCL 100 MG PO TABS: 100.0000 mg | ORAL_TABLET | Freq: Every day | ORAL | 1 refills | Status: AC

## 2024-03-26 MED ORDER — ROSUVASTATIN CALCIUM 40 MG PO TABS: 40.0000 mg | ORAL_TABLET | Freq: Every day | ORAL | 0 refills | Status: AC

## 2024-03-26 MED ORDER — METOPROLOL SUCCINATE ER 25 MG PO TB24: 25.0000 mg | ORAL_TABLET | Freq: Every day | ORAL | 0 refills | Status: AC

## 2024-03-26 MED ORDER — PRAMIPEXOLE DIHYDROCHLORIDE 0.125 MG PO TABS: ORAL_TABLET | ORAL | 0 refills | Status: AC

## 2024-04-26 ENCOUNTER — Encounter: Payer: Self-pay | Admitting: Family Medicine
